# Patient Record
Sex: Female | Born: 1965 | Race: Black or African American | Hispanic: No | Marital: Single | Smoking: Never smoker
Health system: Southern US, Community
[De-identification: ages and names within clinical notes are randomized; demographics above are authoritative.]

---

## 1998-02-26 ENCOUNTER — Emergency Department (HOSPITAL_COMMUNITY): Admission: EM | Admit: 1998-02-26 | Discharge: 1998-02-27 | Payer: Self-pay | Admitting: Emergency Medicine

## 1998-07-16 ENCOUNTER — Emergency Department (HOSPITAL_COMMUNITY): Admission: EM | Admit: 1998-07-16 | Discharge: 1998-07-16 | Payer: Self-pay | Admitting: Emergency Medicine

## 1998-09-11 ENCOUNTER — Emergency Department (HOSPITAL_COMMUNITY): Admission: EM | Admit: 1998-09-11 | Discharge: 1998-09-11 | Payer: Self-pay | Admitting: Emergency Medicine

## 1999-05-21 ENCOUNTER — Emergency Department (HOSPITAL_COMMUNITY): Admission: EM | Admit: 1999-05-21 | Discharge: 1999-05-21 | Payer: Self-pay | Admitting: Emergency Medicine

## 1999-10-10 ENCOUNTER — Emergency Department (HOSPITAL_COMMUNITY): Admission: EM | Admit: 1999-10-10 | Discharge: 1999-10-10 | Payer: Self-pay | Admitting: *Deleted

## 1999-11-27 ENCOUNTER — Emergency Department (HOSPITAL_COMMUNITY): Admission: EM | Admit: 1999-11-27 | Discharge: 1999-11-27 | Payer: Self-pay | Admitting: Emergency Medicine

## 1999-11-28 ENCOUNTER — Emergency Department (HOSPITAL_COMMUNITY): Admission: EM | Admit: 1999-11-28 | Discharge: 1999-11-29 | Payer: Self-pay | Admitting: Emergency Medicine

## 2000-07-21 ENCOUNTER — Emergency Department (HOSPITAL_COMMUNITY): Admission: EM | Admit: 2000-07-21 | Discharge: 2000-07-21 | Payer: Self-pay

## 2000-08-02 ENCOUNTER — Emergency Department (HOSPITAL_COMMUNITY): Admission: EM | Admit: 2000-08-02 | Discharge: 2000-08-03 | Payer: Self-pay | Admitting: Emergency Medicine

## 2000-09-04 ENCOUNTER — Emergency Department (HOSPITAL_COMMUNITY): Admission: EM | Admit: 2000-09-04 | Discharge: 2000-09-04 | Payer: Self-pay | Admitting: *Deleted

## 2000-09-13 ENCOUNTER — Emergency Department (HOSPITAL_COMMUNITY): Admission: EM | Admit: 2000-09-13 | Discharge: 2000-09-13 | Payer: Self-pay | Admitting: *Deleted

## 2000-10-01 ENCOUNTER — Emergency Department (HOSPITAL_COMMUNITY): Admission: EM | Admit: 2000-10-01 | Discharge: 2000-10-01 | Payer: Self-pay | Admitting: Emergency Medicine

## 2001-02-19 ENCOUNTER — Emergency Department (HOSPITAL_COMMUNITY): Admission: EM | Admit: 2001-02-19 | Discharge: 2001-02-19 | Payer: Self-pay

## 2001-04-13 ENCOUNTER — Encounter: Payer: Self-pay | Admitting: Family Medicine

## 2001-04-13 ENCOUNTER — Encounter: Admission: RE | Admit: 2001-04-13 | Discharge: 2001-04-13 | Payer: Self-pay | Admitting: Family Medicine

## 2001-06-02 ENCOUNTER — Emergency Department (HOSPITAL_COMMUNITY): Admission: EM | Admit: 2001-06-02 | Discharge: 2001-06-03 | Payer: Self-pay | Admitting: Emergency Medicine

## 2001-06-12 ENCOUNTER — Emergency Department (HOSPITAL_COMMUNITY): Admission: EM | Admit: 2001-06-12 | Discharge: 2001-06-12 | Payer: Self-pay | Admitting: Emergency Medicine

## 2001-07-19 ENCOUNTER — Inpatient Hospital Stay (HOSPITAL_COMMUNITY): Admission: AD | Admit: 2001-07-19 | Discharge: 2001-07-19 | Payer: Self-pay | Admitting: *Deleted

## 2002-03-15 ENCOUNTER — Encounter: Payer: Self-pay | Admitting: Emergency Medicine

## 2002-03-15 ENCOUNTER — Emergency Department (HOSPITAL_COMMUNITY): Admission: EM | Admit: 2002-03-15 | Discharge: 2002-03-15 | Payer: Self-pay | Admitting: Emergency Medicine

## 2002-05-12 ENCOUNTER — Emergency Department (HOSPITAL_COMMUNITY): Admission: EM | Admit: 2002-05-12 | Discharge: 2002-05-12 | Payer: Self-pay | Admitting: Emergency Medicine

## 2002-05-25 ENCOUNTER — Emergency Department (HOSPITAL_COMMUNITY): Admission: EM | Admit: 2002-05-25 | Discharge: 2002-05-25 | Payer: Self-pay | Admitting: *Deleted

## 2002-11-16 ENCOUNTER — Emergency Department (HOSPITAL_COMMUNITY): Admission: EM | Admit: 2002-11-16 | Discharge: 2002-11-16 | Payer: Self-pay | Admitting: Emergency Medicine

## 2003-01-22 ENCOUNTER — Emergency Department (HOSPITAL_COMMUNITY): Admission: EM | Admit: 2003-01-22 | Discharge: 2003-01-22 | Payer: Self-pay | Admitting: Emergency Medicine

## 2003-01-23 ENCOUNTER — Emergency Department (HOSPITAL_COMMUNITY): Admission: EM | Admit: 2003-01-23 | Discharge: 2003-01-23 | Payer: Self-pay | Admitting: Emergency Medicine

## 2003-01-23 ENCOUNTER — Encounter: Payer: Self-pay | Admitting: Emergency Medicine

## 2003-02-27 ENCOUNTER — Emergency Department (HOSPITAL_COMMUNITY): Admission: EM | Admit: 2003-02-27 | Discharge: 2003-02-28 | Payer: Self-pay | Admitting: Emergency Medicine

## 2003-03-27 ENCOUNTER — Inpatient Hospital Stay (HOSPITAL_COMMUNITY): Admission: AD | Admit: 2003-03-27 | Discharge: 2003-03-27 | Payer: Self-pay | Admitting: Obstetrics & Gynecology

## 2003-03-29 ENCOUNTER — Encounter: Admission: RE | Admit: 2003-03-29 | Discharge: 2003-03-29 | Payer: Self-pay | Admitting: Obstetrics

## 2003-03-29 ENCOUNTER — Encounter: Payer: Self-pay | Admitting: Obstetrics

## 2003-04-13 ENCOUNTER — Encounter: Payer: Self-pay | Admitting: Emergency Medicine

## 2003-04-13 ENCOUNTER — Emergency Department (HOSPITAL_COMMUNITY): Admission: EM | Admit: 2003-04-13 | Discharge: 2003-04-13 | Payer: Self-pay | Admitting: Emergency Medicine

## 2003-07-26 ENCOUNTER — Emergency Department (HOSPITAL_COMMUNITY): Admission: EM | Admit: 2003-07-26 | Discharge: 2003-07-27 | Payer: Self-pay | Admitting: *Deleted

## 2003-08-01 ENCOUNTER — Emergency Department (HOSPITAL_COMMUNITY): Admission: EM | Admit: 2003-08-01 | Discharge: 2003-08-01 | Payer: Self-pay | Admitting: Emergency Medicine

## 2004-09-19 ENCOUNTER — Emergency Department (HOSPITAL_COMMUNITY): Admission: EM | Admit: 2004-09-19 | Discharge: 2004-09-19 | Payer: Self-pay | Admitting: Family Medicine

## 2005-01-09 ENCOUNTER — Emergency Department (HOSPITAL_COMMUNITY): Admission: EM | Admit: 2005-01-09 | Discharge: 2005-01-09 | Payer: Self-pay | Admitting: Emergency Medicine

## 2005-01-26 ENCOUNTER — Emergency Department (HOSPITAL_COMMUNITY): Admission: EM | Admit: 2005-01-26 | Discharge: 2005-01-26 | Payer: Self-pay | Admitting: Emergency Medicine

## 2005-03-26 ENCOUNTER — Inpatient Hospital Stay (HOSPITAL_COMMUNITY): Admission: AD | Admit: 2005-03-26 | Discharge: 2005-03-26 | Payer: Self-pay | Admitting: Obstetrics

## 2005-04-09 ENCOUNTER — Ambulatory Visit: Payer: Self-pay | Admitting: Obstetrics and Gynecology

## 2005-04-15 ENCOUNTER — Ambulatory Visit (HOSPITAL_COMMUNITY): Admission: RE | Admit: 2005-04-15 | Discharge: 2005-04-15 | Payer: Self-pay | Admitting: *Deleted

## 2005-05-07 ENCOUNTER — Encounter (INDEPENDENT_AMBULATORY_CARE_PROVIDER_SITE_OTHER): Payer: Self-pay | Admitting: *Deleted

## 2005-05-07 ENCOUNTER — Other Ambulatory Visit: Admission: RE | Admit: 2005-05-07 | Discharge: 2005-05-07 | Payer: Self-pay | Admitting: Obstetrics and Gynecology

## 2005-05-07 ENCOUNTER — Ambulatory Visit: Payer: Self-pay | Admitting: Obstetrics and Gynecology

## 2005-07-07 ENCOUNTER — Ambulatory Visit: Payer: Self-pay | Admitting: Obstetrics and Gynecology

## 2005-08-11 ENCOUNTER — Ambulatory Visit: Payer: Self-pay | Admitting: Obstetrics and Gynecology

## 2005-08-11 ENCOUNTER — Ambulatory Visit (HOSPITAL_COMMUNITY): Admission: RE | Admit: 2005-08-11 | Discharge: 2005-08-11 | Payer: Self-pay | Admitting: Obstetrics and Gynecology

## 2005-11-17 ENCOUNTER — Inpatient Hospital Stay (HOSPITAL_COMMUNITY): Admission: AD | Admit: 2005-11-17 | Discharge: 2005-11-17 | Payer: Self-pay | Admitting: Obstetrics

## 2007-10-24 ENCOUNTER — Emergency Department (HOSPITAL_COMMUNITY): Admission: EM | Admit: 2007-10-24 | Discharge: 2007-10-24 | Payer: Self-pay | Admitting: Emergency Medicine

## 2008-03-24 ENCOUNTER — Emergency Department (HOSPITAL_BASED_OUTPATIENT_CLINIC_OR_DEPARTMENT_OTHER): Admission: EM | Admit: 2008-03-24 | Discharge: 2008-03-25 | Payer: Self-pay | Admitting: Emergency Medicine

## 2008-04-19 ENCOUNTER — Ambulatory Visit: Payer: Self-pay | Admitting: Obstetrics & Gynecology

## 2008-05-01 ENCOUNTER — Ambulatory Visit (HOSPITAL_COMMUNITY): Admission: RE | Admit: 2008-05-01 | Discharge: 2008-05-01 | Payer: Self-pay | Admitting: Obstetrics & Gynecology

## 2008-05-01 ENCOUNTER — Ambulatory Visit: Payer: Self-pay | Admitting: Obstetrics & Gynecology

## 2008-05-04 ENCOUNTER — Encounter: Admission: RE | Admit: 2008-05-04 | Discharge: 2008-05-04 | Payer: Self-pay | Admitting: Obstetrics and Gynecology

## 2008-05-11 ENCOUNTER — Ambulatory Visit: Payer: Self-pay | Admitting: Obstetrics & Gynecology

## 2008-05-11 ENCOUNTER — Encounter: Payer: Self-pay | Admitting: Obstetrics & Gynecology

## 2008-05-22 ENCOUNTER — Encounter: Admission: RE | Admit: 2008-05-22 | Discharge: 2008-05-22 | Payer: Self-pay | Admitting: Infectious Diseases

## 2008-05-22 ENCOUNTER — Encounter (INDEPENDENT_AMBULATORY_CARE_PROVIDER_SITE_OTHER): Payer: Self-pay | Admitting: Radiology

## 2008-06-27 ENCOUNTER — Emergency Department (HOSPITAL_COMMUNITY): Admission: EM | Admit: 2008-06-27 | Discharge: 2008-06-27 | Payer: Self-pay | Admitting: Emergency Medicine

## 2008-10-24 ENCOUNTER — Ambulatory Visit: Payer: Self-pay | Admitting: Obstetrics & Gynecology

## 2008-10-30 ENCOUNTER — Ambulatory Visit (HOSPITAL_COMMUNITY): Admission: RE | Admit: 2008-10-30 | Discharge: 2008-10-30 | Payer: Self-pay | Admitting: Obstetrics & Gynecology

## 2008-12-26 ENCOUNTER — Other Ambulatory Visit: Payer: Self-pay | Admitting: Obstetrics & Gynecology

## 2009-01-01 ENCOUNTER — Ambulatory Visit: Payer: Self-pay | Admitting: Obstetrics & Gynecology

## 2009-01-01 ENCOUNTER — Inpatient Hospital Stay (HOSPITAL_COMMUNITY): Admission: RE | Admit: 2009-01-01 | Discharge: 2009-01-03 | Payer: Self-pay | Admitting: Obstetrics & Gynecology

## 2009-01-01 ENCOUNTER — Encounter: Payer: Self-pay | Admitting: Obstetrics & Gynecology

## 2009-01-23 ENCOUNTER — Ambulatory Visit: Payer: Self-pay | Admitting: Obstetrics & Gynecology

## 2009-04-18 IMAGING — US US PELVIS COMPLETE MODIFY
1 series · 13 of 25 positions shown · non-contrast
Comparison: 04/15/2005

CLINICAL DATA: Pelvic pain.  Fibroids.

TRANSABDOMINAL AND TRANSVAGINAL ULTRASOUND OF PELVIS
TECHNIQUE: Both transabdominal and transvaginal ultrasound
examinations of the pelvis were performed including evaluation of
the uterus, ovaries, adnexal regions, and pelvic cul-de-sac.

[Series 1: us pelvis complete modify · 0.28mm/px · 13 of 64 slices shown]
[im 1/64]
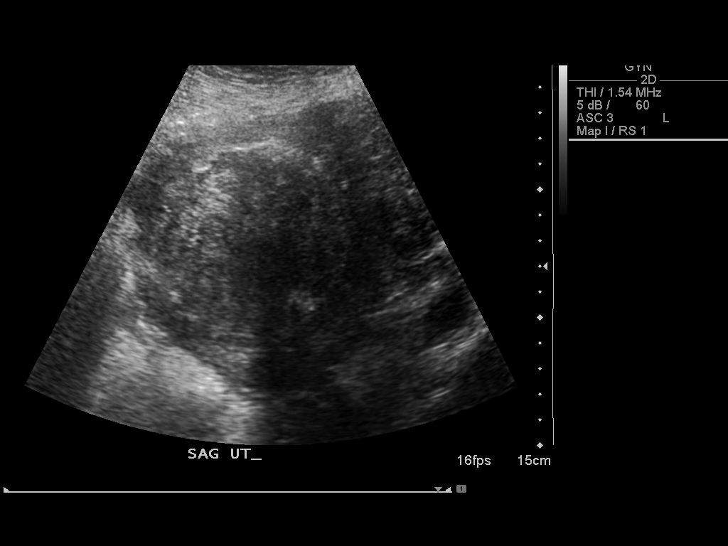
[im 6/64]
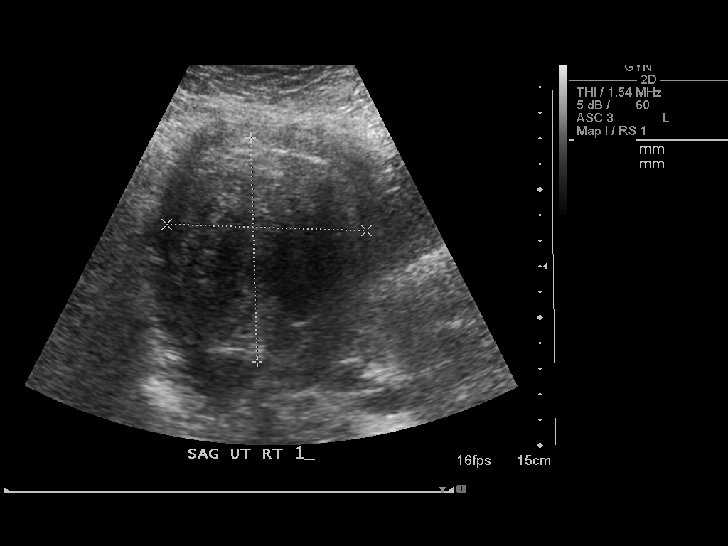
[im 11/64]
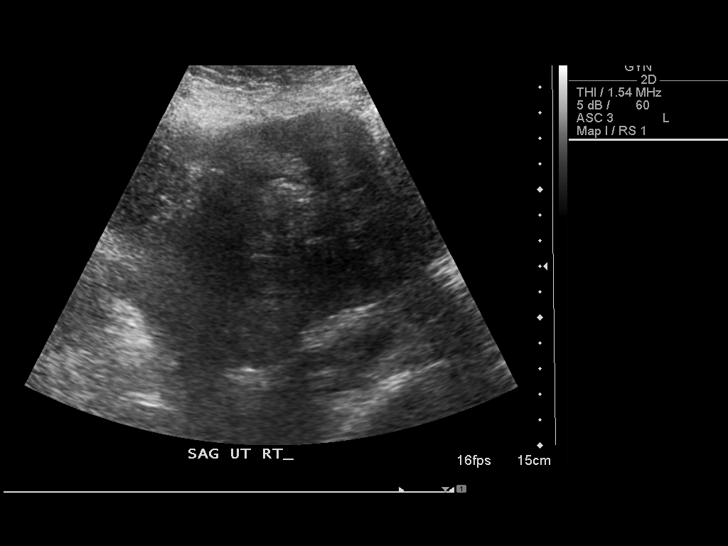
[im 16/64]
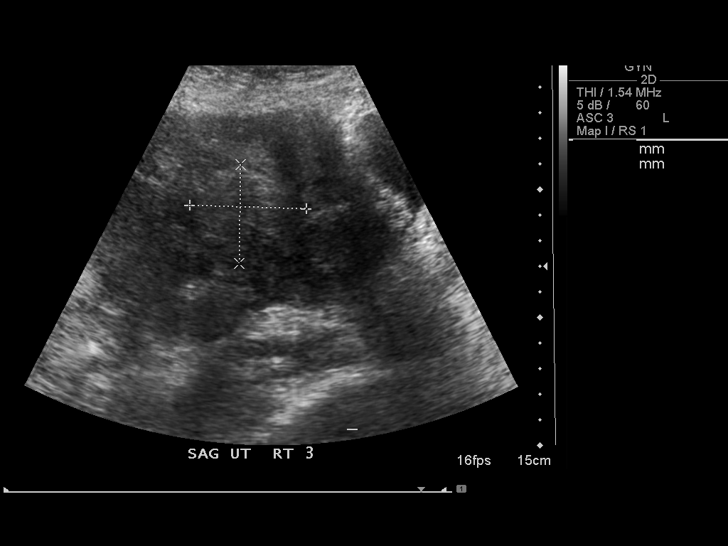
[im 22/64]
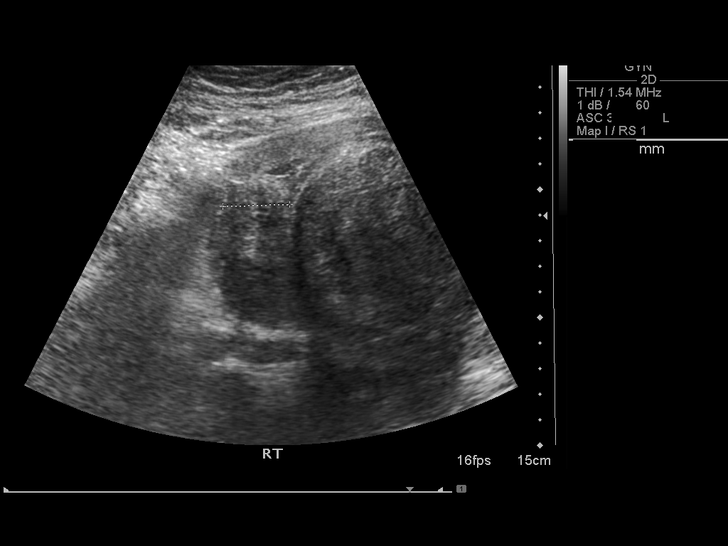
[im 27/64]
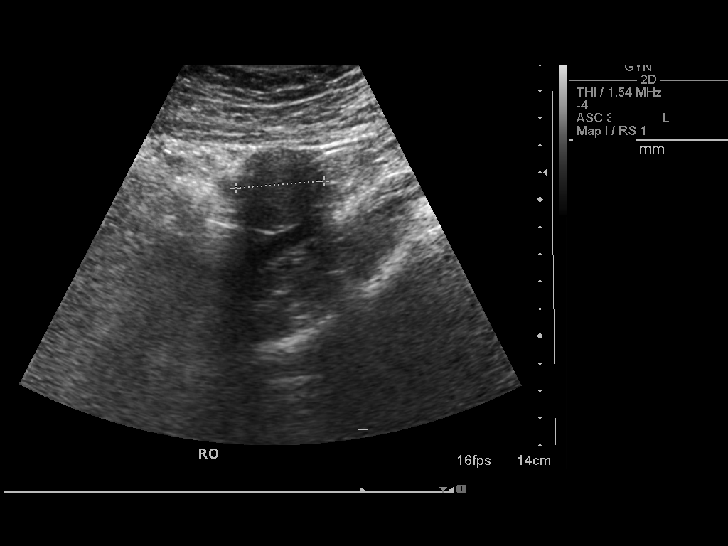
[im 32/64]
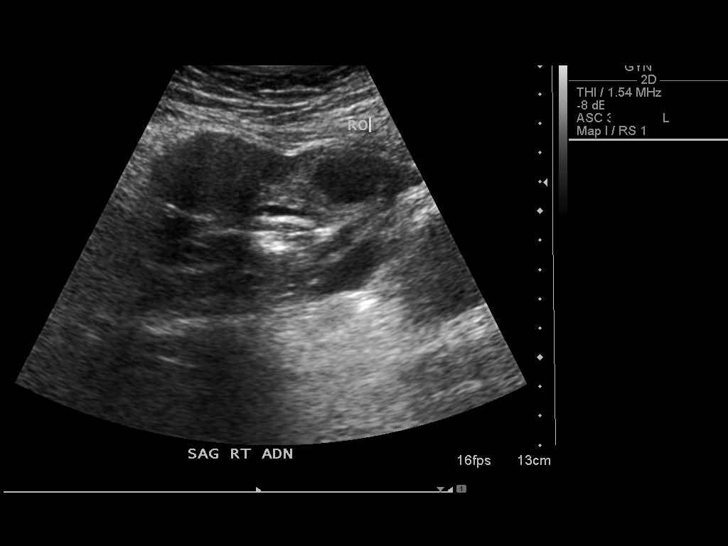
[im 37/64]
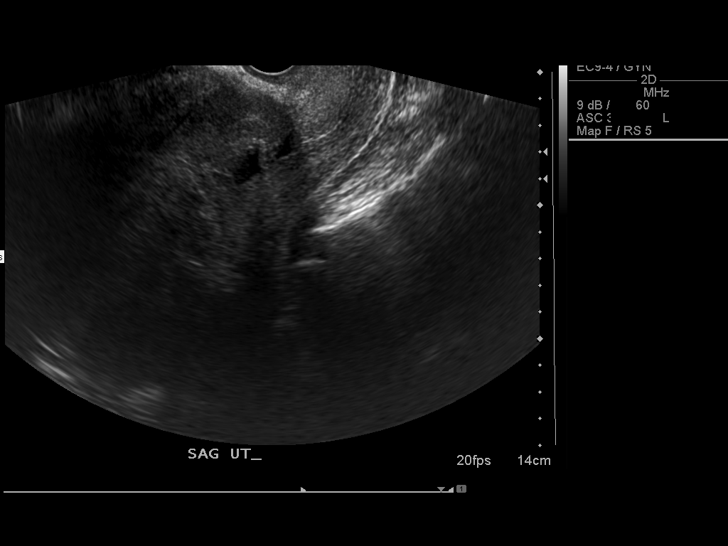
[im 43/64]
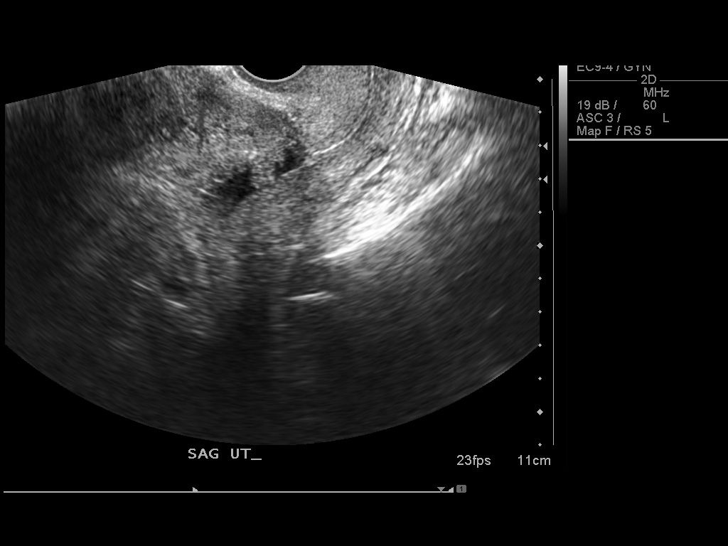
[im 48/64]
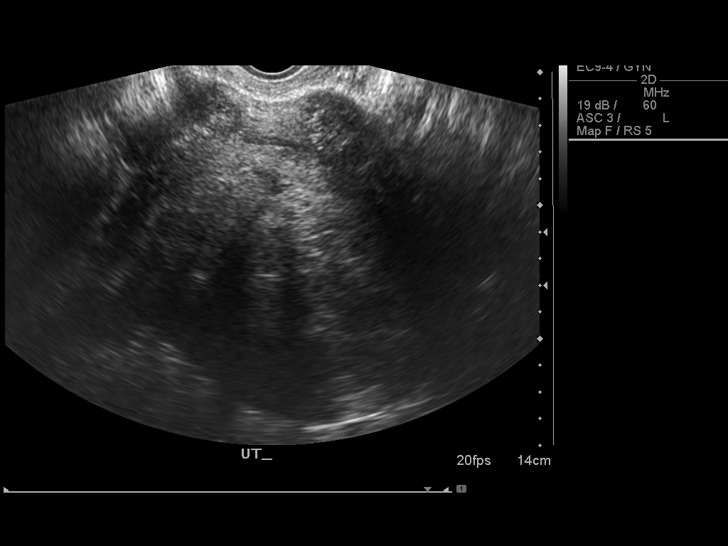
[im 53/64]
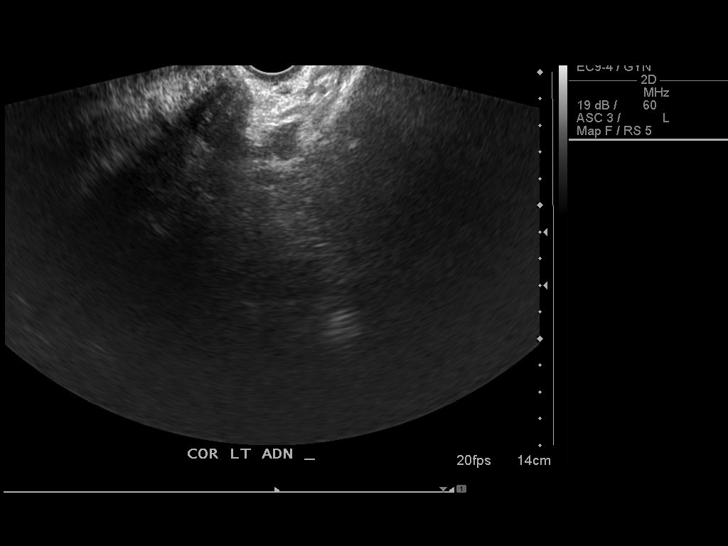
[im 58/64]
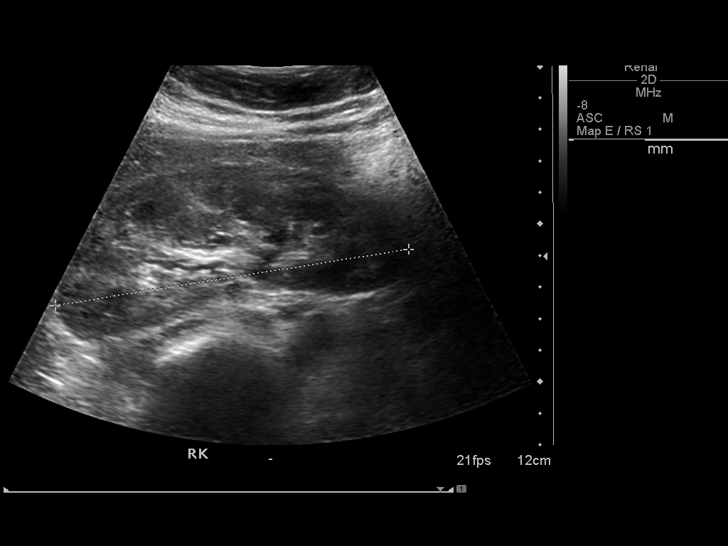
[im 64/64]
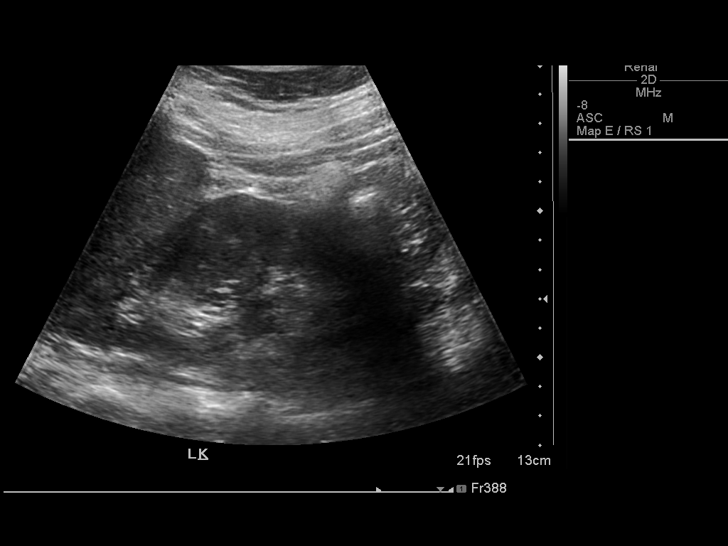

[13 of 25 positions shown; findings below may reference images not displayed]

FINDINGS: The uterus has further increase in size since prior
study, now measuring approximately 18 cm length by 11 cm AP
diameter by 12 cm transverse diameter.

Multiple uterine fibroids are seen involving the uterus diffusely.
Least seven discrete fibroids are identified.  The largest of these
is in the fundal region and has a submucosal component.  This
fibroid now measures 8.7 x 7.8 x 7.0 cm compared to 4.7 by 4.5 x
4.4 cm on prior exam.  A smaller fibroid in the central uterine
body also has a submucosal component, and this fibroid measures
cm in greatest diameter.  Other fibroids range in size from 2.0 cm
to 4.2 cm and also show increase compared to prior exam.

The endometrium is not visualized by transabdominal or transvaginal
sonography due to acoustic shadowing from these fibroids.  Both
ovaries are visualized and are normal in appearance. No other
adnexal masses are identified there is no evidence of free fluid.

Limited images of no evidence of hydronephrosis.
IMPRESSION: 1.  Increased size and number of uterine fibroids, with largest
fibroid in the fundus measuring 8.7 cm compared to 4.7 cm on prior
study.
2.  Normal ovaries.

## 2009-05-23 ENCOUNTER — Ambulatory Visit: Payer: Self-pay | Admitting: Obstetrics & Gynecology

## 2009-06-27 ENCOUNTER — Ambulatory Visit (HOSPITAL_COMMUNITY): Admission: RE | Admit: 2009-06-27 | Discharge: 2009-06-27 | Payer: Self-pay | Admitting: Obstetrics and Gynecology

## 2009-07-02 ENCOUNTER — Encounter: Admission: RE | Admit: 2009-07-02 | Discharge: 2009-07-02 | Payer: Self-pay | Admitting: Obstetrics & Gynecology

## 2009-09-07 HISTORY — PX: MYOMECTOMY: SHX85

## 2009-10-02 IMAGING — US US PELVIS COMPLETE MODIFY
2 series · 14 of 25 positions shown · non-contrast
Comparison: May 01, 2008

CLINICAL DATA: Abnormal bleeding; history of fibroids

TRANSABDOMINAL AND TRANSVAGINAL ULTRASOUND OF PELVIS
TECHNIQUE: Both transabdominal and transvaginal ultrasound
examinations of the pelvis were performed including evaluation of
the uterus, ovaries, adnexal regions, and pelvic cul-de-sac.

[Series 1: us transvaginal non-ob · 13 of 127 slices shown (1 of 2)]
[im 1/127]
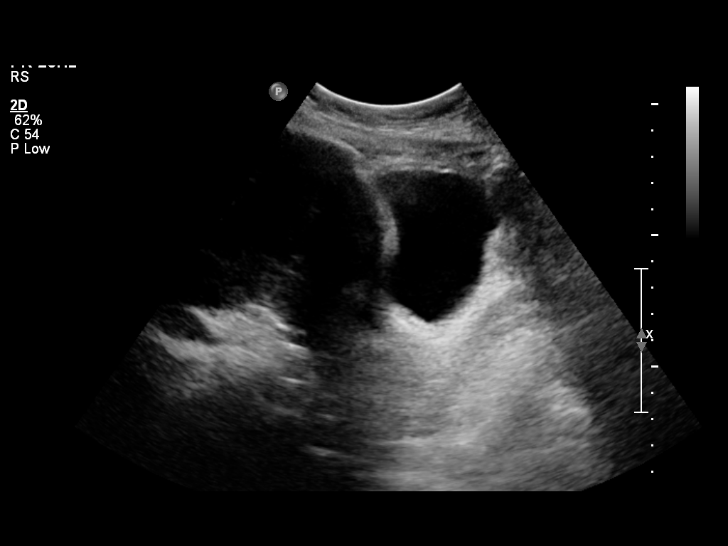
[im 11/127]
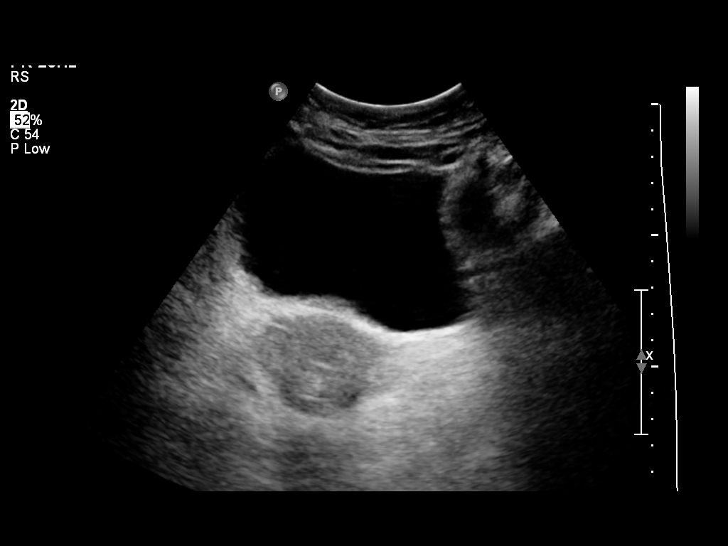
[im 22/127]
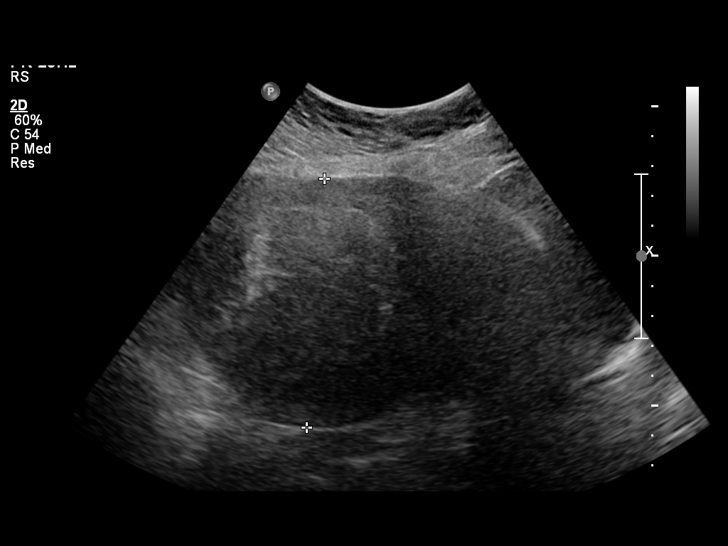
[im 33/127]
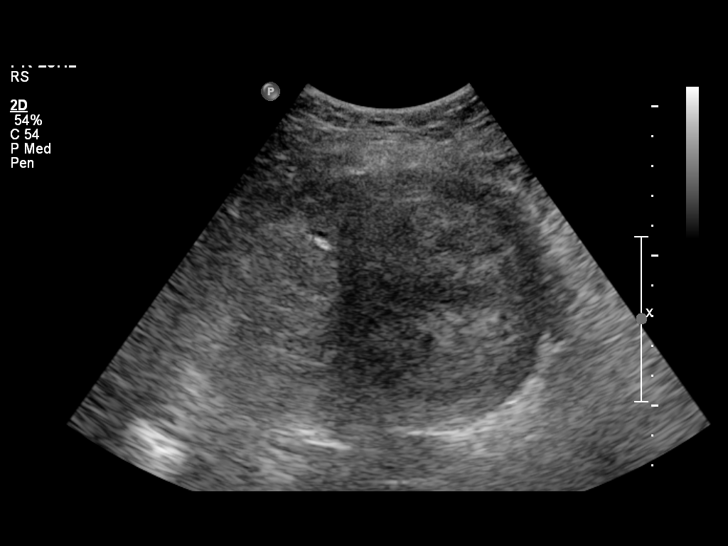
[im 44/127]
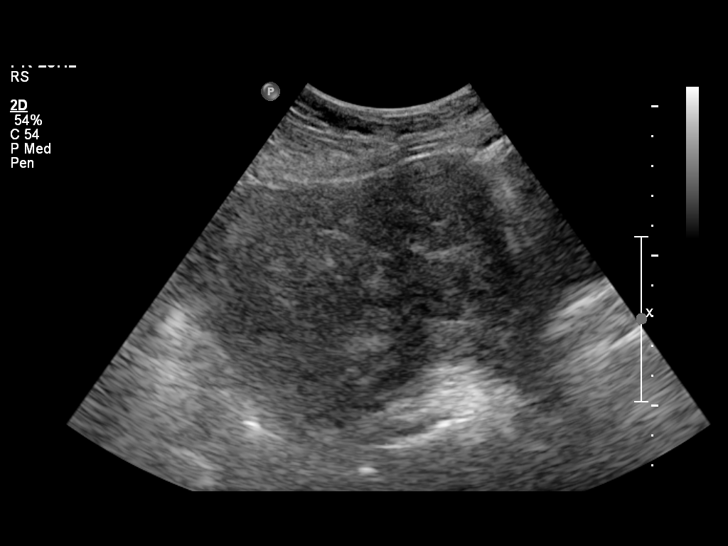
[im 50/127]
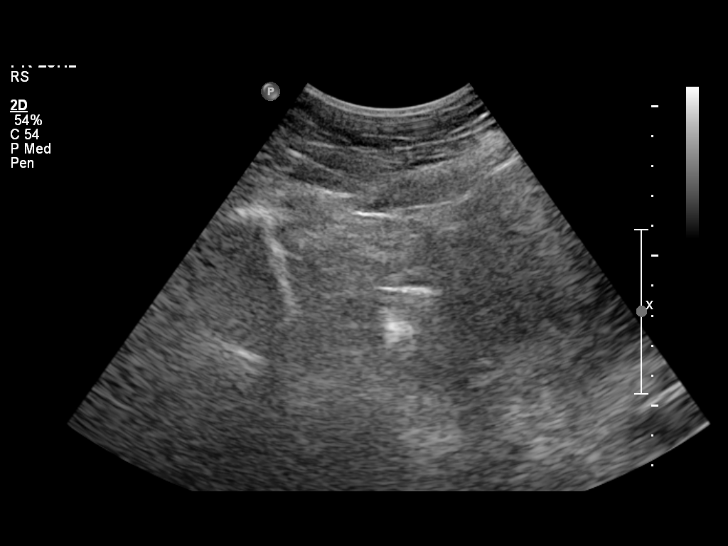
[im 61/127]
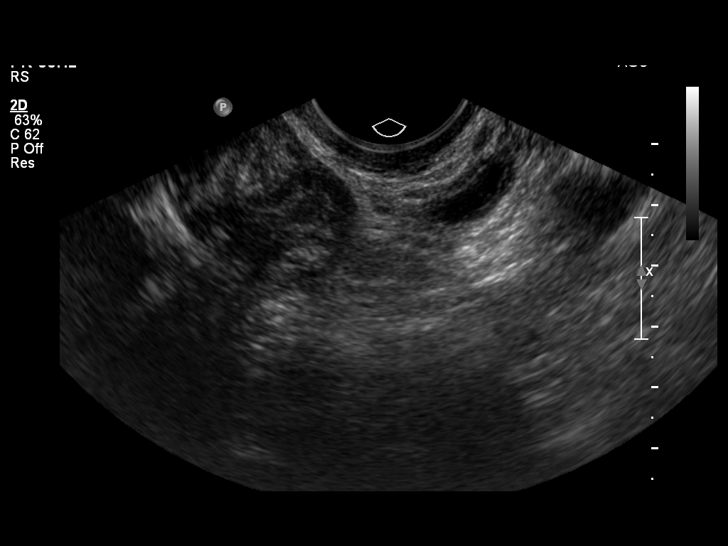
[im 72/127]
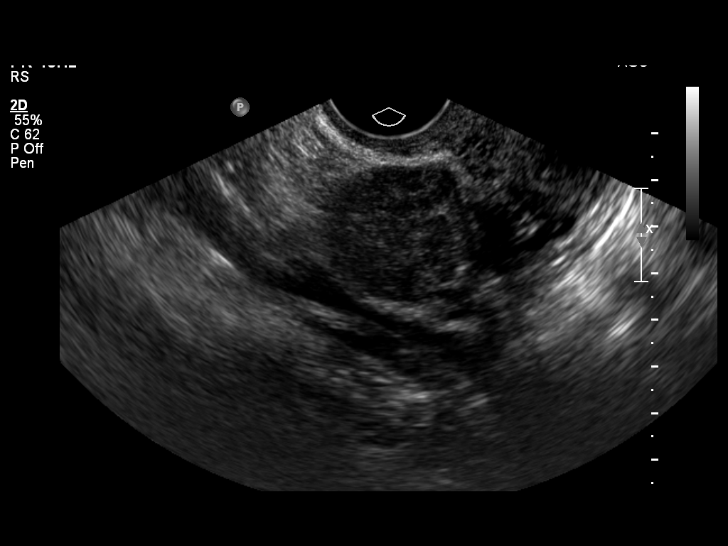
[im 83/127]
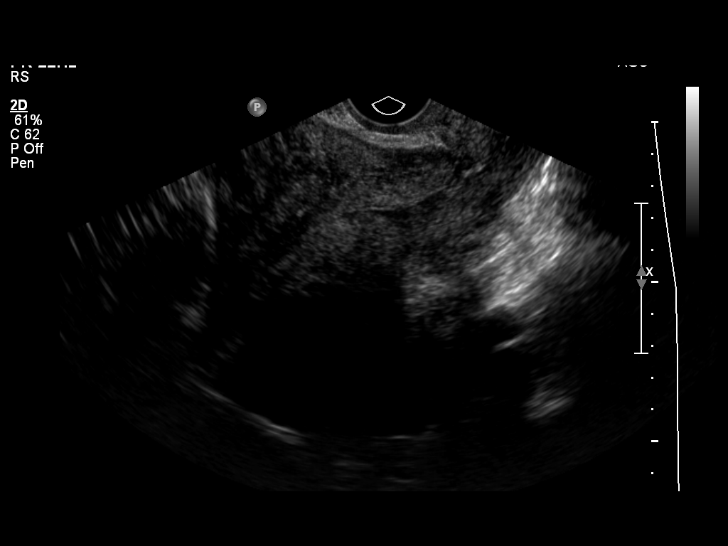
[im 88/127]
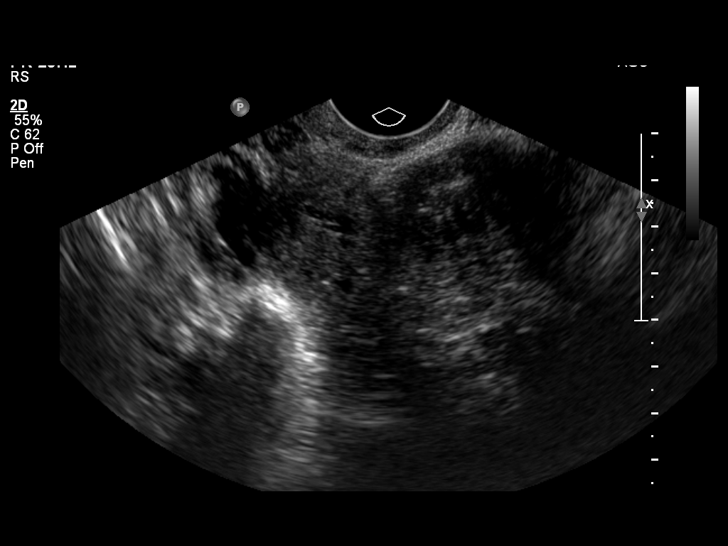
[im 99/127]
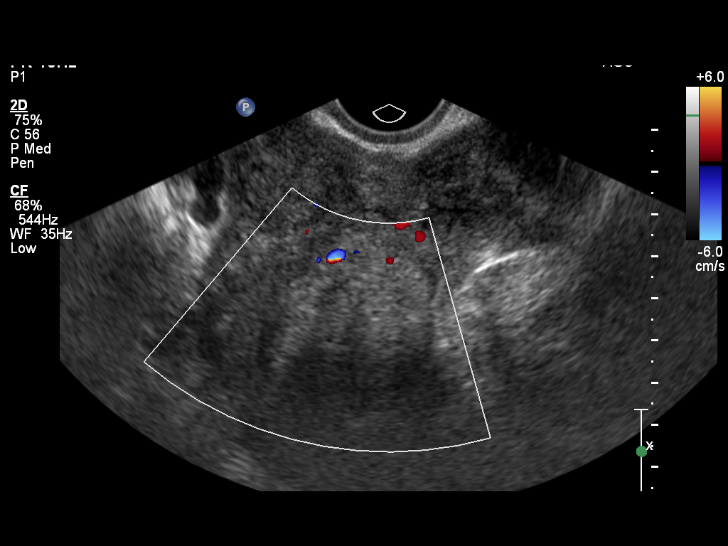
[im 110/127]
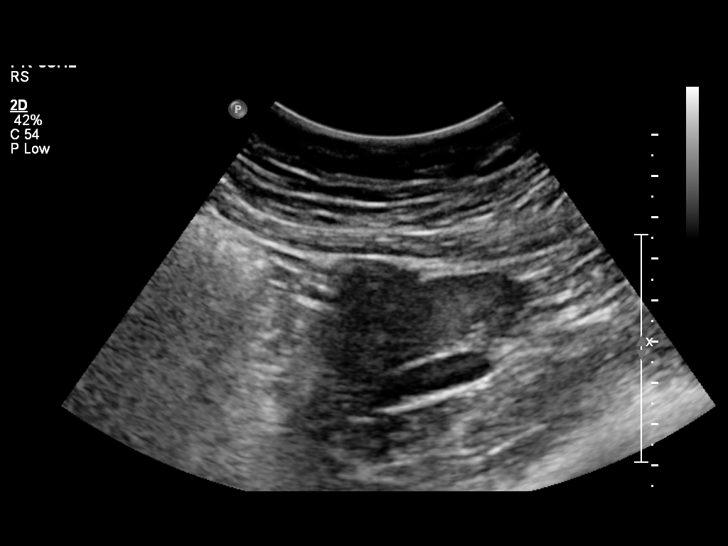
[im 121/127]
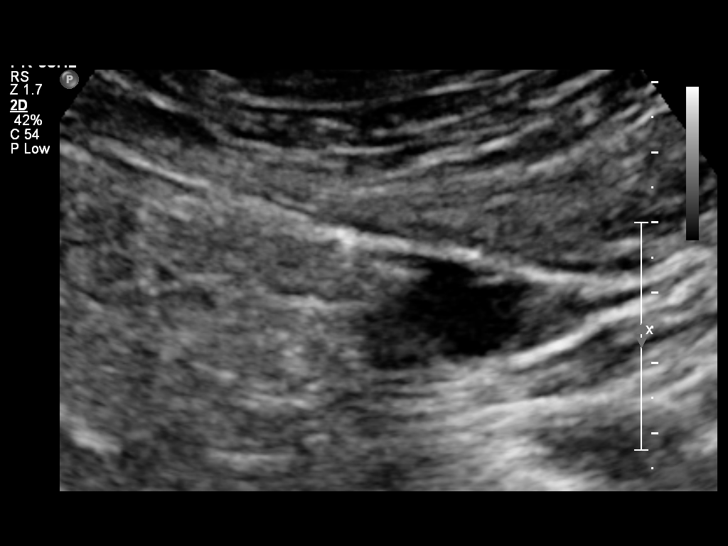

[Series 4: us transvaginal non-ob · 1 of 256 frames shown (2 of 2)]
[frame 129/256]
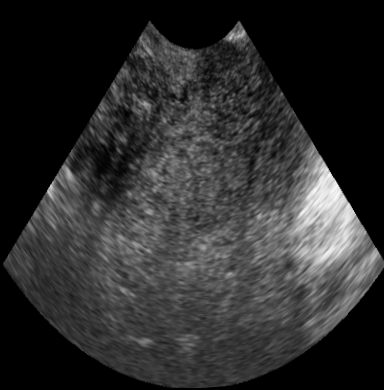

[14 of 25 positions shown; findings below may reference images not displayed]

FINDINGS: The uterus is enlarged and lobular in contour.  The
uterine dimensions are 19.3 x 8.4 x 12.0 centimeters, not
significantly changed compared the prior study.  Numerous fibroids
are present.  The largest is subserosal at the left fundus,
measuring 7.9 cm, not significantly changed.  There is also a
submucosal fibroid which measures 5 cm, unchanged.  The remainder
of the myometrial none subserosal fibroids appear unchanged, as
well.  The endometrium is obscured by the numerous fibroids.

Both ovaries have a normal size and appearance.  The right ovary
measures 5.8 x 2.9 x 4.1 cm, and the left ovary measures 3.4 x
x 2.7 cm.  There is no free pelvic fluid.
IMPRESSION: Fibroid uterus.  The size and number of the multiple fibroids
appear unchanged.

## 2010-06-16 ENCOUNTER — Ambulatory Visit: Payer: Self-pay | Admitting: Family Medicine

## 2010-06-16 LAB — CONVERTED CEMR LAB: Pap Smear: NEGATIVE

## 2010-07-08 ENCOUNTER — Ambulatory Visit (HOSPITAL_COMMUNITY): Admission: RE | Admit: 2010-07-08 | Discharge: 2010-07-08 | Payer: Self-pay | Admitting: Family Medicine

## 2010-09-10 ENCOUNTER — Ambulatory Visit: Admit: 2010-09-10 | Payer: Self-pay | Admitting: Obstetrics & Gynecology

## 2010-11-12 ENCOUNTER — Emergency Department (HOSPITAL_BASED_OUTPATIENT_CLINIC_OR_DEPARTMENT_OTHER)
Admission: EM | Admit: 2010-11-12 | Discharge: 2010-11-12 | Disposition: A | Discharge status: Home / Self Care | Payer: Self-pay | Attending: Emergency Medicine | Admitting: Emergency Medicine

## 2010-11-12 DIAGNOSIS — R05 Cough: Secondary | ICD-10-CM | POA: Insufficient documentation

## 2010-11-12 DIAGNOSIS — J40 Bronchitis, not specified as acute or chronic: Secondary | ICD-10-CM | POA: Insufficient documentation

## 2010-11-12 DIAGNOSIS — R059 Cough, unspecified: Secondary | ICD-10-CM | POA: Insufficient documentation

## 2010-12-17 LAB — CBC
HCT: 29.1 % — ABNORMAL LOW (ref 36.0–46.0)
MCHC: 33 g/dL (ref 30.0–36.0)
MCV: 81.9 fL (ref 78.0–100.0)
Platelets: 317 10*3/uL (ref 150–400)
RDW: 15.5 % (ref 11.5–15.5)
RDW: 17.5 % — ABNORMAL HIGH (ref 11.5–15.5)
WBC: 11.6 10*3/uL — ABNORMAL HIGH (ref 4.0–10.5)
WBC: 6.2 10*3/uL (ref 4.0–10.5)

## 2010-12-17 LAB — CROSSMATCH

## 2010-12-17 LAB — TYPE AND SCREEN
ABO/RH(D): O POS
Antibody Screen: NEGATIVE

## 2010-12-17 LAB — ABO/RH: ABO/RH(D): O POS

## 2011-01-20 NOTE — Discharge Summary (Signed)
[name], DENSON               ACCOUNT NO.:  [id number]   MEDICAL RECORD NO.:  [id number]          PATIENT TYPE:  INP   LOCATION:  9311                          FACILITY:  [facility]   PHYSICIAN:  Norton Blizzard, MD    DATE OF BIRTH:  October 29, 1965   DATE OF ADMISSION:  01/01/2009  DATE OF DISCHARGE:  01/03/2009                               DISCHARGE SUMMARY   ADMISSION DIAGNOSES:  1. Uterine fibroids.  2. Menorrhagia.  3. Pelvic pain.   DISCHARGE DIAGNOSES AND PROCEDURES:  Status post abdominal myomectomy.  The patient was also transfused with 2 units of packed red blood cells  in the operating room for preoperative anemia and hemoglobin of 9.3.   PERTINENT STUDIES:  Preoperative hemoglobin of 9.3, and platelet count  317.  The patient was transfused with 2 units of packed red blood cells  in the operating room.  Her postoperative hemoglobin was 9.6, and  platelet count 254.   PATHOLOGY REPORT:  Uterine fibroids, fragments of leiomyomata.  There  were 26 rubbery tan, white ovoid nodules varying in size from 0.5-8.5 cm  in greatest dimension and weighed 458 g at time of gross examination.  Of note, they weighed 536.1 g in the operating room immediately after  the myomectomy.   BRIEF HOSPITAL COURSE:  The patient is a 45 year old gravida 3, para 3  with history of 3 cesarean sections who had a symptomatic fibroid  uterus.  The patient desired future child bearing and wanted a  myomectomy.  The patient was counseled for hysterectomy, but the patient  persisted that she wanted a myomectomy because of her plans for future  childbearing.  The risks of surgery were reviewed with the patient; also  reviewed the risk during subsequent pregnancies including having to  deliver around 36- [redacted] weeks gestation as the endometrium will be  breached significantly increasing her risk of uterine rupture, and risk  of abnormal placentation including previa, accreta or worse.  The  patient verbalized  understanding of plan and wanted to proceed with  abdominal myomectomy.  She underwent an uncomplicated abdominal  myomectomy on January 01, 2009.  Her estimated blood loss was 800 mL.  Given that she was anemic preoperatively, the decision was made to  transfuse with 2 units of blood in the operating room, which resulted in  the postoperative hemoglobin of 9.6 compared to her preoperative  hemoglobin of 9.3.  After surgery, the patient had an uncomplicated post  surgical course.  She had no signs or symptoms of anemia and was started  on ferrous sulfate therapy.  By postoperative day #2, her pain was  controlled on oral pain medications.  She was ambulating and voiding  without difficulty, tolerating regular diet, and passing flatus.  The  patient was deemed stable for discharge to home.   DISCHARGE MEDICATIONS:  1. Percocet 5/325 mg 1-2 tablets p.o. q.6 h. p.r.n. pain  2. Ibuprofen 600 mg p.o. q.6 h. p.r.n. pain.  3. Colace 100 mg p.o. b.i.d. p.r.n. constipation.   DISCHARGE INSTRUCTIONS:  The patient was given the routine postoperative  instructions  including, but not limited to pelvic rest for 6 weeks, no  lifting anything greater than 15 pounds for the next 6 weeks, no driving  while on narcotic pain medications, and to keep her incision clean and  dry.  The patient was advised to shower and not to sit in bathtubs or  pools of water.  She was also strongly advised to return to the [facility] for  evaluation of any fevers, nausea, vomiting, abnormal bleeding,  incisional issues, or any other postoperative concerns. Of note, during  surgery, the endometrium was breached and the patient was told that that  is akin to having a classical cesarean section.  If she does get  pregnant, she will need a repeat cesarean section to be scheduled at 62  or 37 weeks' gestation.  The patient does also understand that given her  myomectomy she will have increased adhesive disease, which should make  her  repeat cesarean section or any subsequent surgeries more technically  difficult, and that she has an increased risk of a placenta previa,  accreta, or other abnormal placentation which might result in having  significant bleeding, maternal and fetal morbidity/mortality and also a  possible cesarean hysterectomy.   FOLLOWUP APPOINTMENTS:  The patient is to follow up with Dr. Silas Flood in the  Gynecologic Clinic Jan 23, 2009 at 1:15 p.m.  This appointment was  communicated to the patient.      Norton Blizzard, MD  Electronically Signed     UAD/MEDQ  D:  01/03/2009  T:  01/04/2009  Job:  [id number]

## 2011-01-20 NOTE — Op Note (Signed)
NAMEENSLEY, BLAS               ACCOUNT NO.:  [id number]   MEDICAL RECORD NO.:  [id number]          PATIENT TYPE:  INP   LOCATION:  9311                          [facility]ACILITY:  [facility]   PHYSICIAN:  Norton Blizzard, MD    DATE OF BIRTH:  1966-01-11   DATE OF PROCEDURE:  01/01/2009  DATE OF DISCHARGE:                               OPERATIVE REPORT   PREOPERATIVE DIAGNOSES:  1. Uterine fibroids.  2. Menorrhagia.  3. Pelvic pain.   POSTOPERATIVE DIAGNOSES:  1. Uterine fibroids.  2. Menorrhagia.  3. Pelvic pain.   PROCEDURE:  Abdominal myomectomy.   SURGEON:  Norton Blizzard, MD   ASSISTANT:  Scheryl Darter, MD   ANESTHESIA:  General.   INTRAVENOUS FLUIDS:  2600 mL of crystalloid, 2 units of packed red blood  cells.   ESTIMATED BLOOD LOSS:  800 mL.   INDICATIONS:  The patient is a 45 year old gravida 3, para 3 with a  history of 3 cesarean sections who was followed in the Gynecologic  Clinic for history of symptomatic fibroid uterus causing menorrhagia and  chronic pelvic pain.  The patient was initially treated with Depo  Lupron; however, she had a lot of night sweats and mood swings and did  not want to continue with this method.  She was very interested in a  myomectomy and refused having a hysterectomy because she is interested  in future fertility.  She insisted that she would appreciate this  procedure done in a way that would not jeopardize her future  childbearing capability.  Prior to surgery, the risks of surgery were  reviewed including but not limited to: bleeding which might require  transfusion or hysterectomy in the event of severe bleeding; infection  which might require antibiotics; injury to surrounding internal organs  including her bladder, intestines, or ureters; risk of encountering  significant adhesive disease given her 3 prior abdominal surgeries; risk  of abnormal placentation in the form of a previa, accreta or worse in  subsequent pregnancies given  her baseline increased risk factor of  having 3 prior cesarean sections, and written informed consent was  obtained.   FINDINGS:  20-week sized fibroid uterus, normal adnexa bilaterally.  26  fibroids were removed from serosal, subserosal, myometrial, and  submucosal locations.  The endometrial cavity was entered.  The sizes of  the fibroids were between 5 mm and 7 cm.  Total weight in the OR of all  fibroids were 536.1 g.  The patient had minimal adhesive disease from  her 3 cesarean sections; most of the adhesive disease was around lower  uterine segment involving the vesicouterine peritoneum.  The biggest  fibroid was submucosal and about 7 cm in diameter.  There were 2 large  vertical incisions made in the posterior fundus through which most of  the fibroids were removed, and there was a low transverse incision that  was made on the anterior surface through which multiple fibroids were  also removed.  There were about 3 smaller incisions that were made on  the top and anterior parts of the fundus to remove multiple  subserosal  and serosal fibroids.   SPECIMENS:  Multiple uterine fibroids.   DISPOSITION:  Pathology.   COMPLICATIONS:  None immediately after the case.   PROCEDURE DETAILS:  The patient received intravenous cefotetan and had  sequential compression devices applied to her lower extremities while in  the preoperative area.  She was then taken to the operating room where  general anesthesia was administered and found to be adequate.  The  patient was placed in a dorsal supine position, and prepped and draped  in a sterile manner.  A Foley catheter was inserted into the patient's  bladder and attached to constant gravity.  Attention was then turned to  the patient's abdomen where the decision was made to proceed with a  Pfannenstiel incision over her preexisting scar, as the patient was  noted to have a significant pannus.  This incision was made with the  scalpel, and  then carried through to the underlying layer of fascia  using electrocautery.  The fascia was incised in the midline, and this  incision was extended bilaterally using electrocautery.  Kochers were  applied to the superior aspect of the fascial incision, and the  underlying rectus muscles were dissected off bluntly and sharply.  Of  note, there were a lot of perforating vessels from the rectus muscles  bed and there was some bleeding encountered which was controlled using  electrocautery.  The rectus muscles were separated in the midline  bluntly and the peritoneum was also entered bluntly.  This peritoneal  incision was extended superiorly and inferiorly with care given to avoid  bowel and bladder injury.  Attention was then turned to the fibroid  uterus which was successfully exteriorized and multiple fibroids were  immediately noted.  There were 3 large incisions that were done, 1  anteriorly and 2 posteriorly.  The 2 posterior incisions were vertical  and the 1 large anterior incision was transverse and multiple fibroids  were removed from these incision.  Prior to making the incisions, a  solution containing 20 units of vasopressin diluted in 100 mL of normal  saline was injected on the serosal layer over the planned hysterotomies.  There was definitely a breach into the endometrial cavity during the  removal of the largest fibroid.  Once the fibroids were removed from an  incision, that incision was closed in several layers.  The endometrial  layer was closed with 3-0 Vicryl in running interlocking stitch, the  myometrial layer was closed with 0 Vicryl in a running interlocking  stitch and finally the serosal layer was closed with a 2-0 Vicryl  baseball stitch.  This was done on all incisions depending on the depth  of the incision with the appropriate suture.  There were smaller  subserosal and serosal fibroids that were removed from smaller incisions  and these were made hemostatic  as a result of an electrocautery or with  serosal stitches using 2-0 Vicryl in a baseball stitch configuration.  A  total of 26 fibroids were removed, size range from 5 mm to 7 cm,  and  they were found in all locations of the uterus (serosal, subserosal,  myometrial, and submucosal).  Of note, the patient's preoperative  hemoglobin was noted to be 9.3 and given that she lost significant  amount of blood, 2 units of packed red blood cells were transfused in  the operating room.  At the end of the case, there was overall good  hemostasis.  A sheet of Intercede was  placed over the large incisions on  the posterior and anterior uterine surfaces as an adhesion barrier, and  the uterus was internalized.  The peritoneal layer was reapproximated  using 2-0 Vicryl in a running stitch.  The fascial layer was  reapproximated  using 0 PDS in a running fashion.  The subcutaneous layer was copiously  irrigated and reapproximated with a 3-0 Vicryl subcuticular stitch.  The  patient tolerated the procedure and blood transfusion well.  Sponge,  instrument, and needle counts were correct x2.  She was taken to the  recovery room extubated, awake, and in stable condition.      Norton Blizzard, MD  Electronically Signed     UAD/MEDQ  D:  01/01/2009  T:  01/02/2009  Job:  [id number]

## 2011-01-20 NOTE — Group Therapy Note (Signed)
[name], [name] NO.:  [id number]   MEDICAL RECORD NO.:  [id number]          PATIENT TYPE:  WOC   LOCATION:  [facility]                   FACILITY:  [facility]   PHYSICIAN:  Elsie Lincoln, MD      DATE OF BIRTH:  [birth year]   DATE OF SERVICE:                                  CLINIC NOTE   HISTORY OF PRESENT ILLNESS:  The patient returns for routine followup  for issues with fibroids.  The patient has a past history of uterine  fibroids and a transabdominal/transvaginal ultrasound performed on  May 01, 2008.  This showed increase in size and number of uterine  fibroids with the largest in the fundus measuring 8.7 cm, compared to  4.7 cm on prior ultrasound scan.  Since her last visit here at the  [facility], she has also had mammogram that showed possible  left breast mass.  Since then, she has had the followup mammogram and is  now scheduled for breast biopsy with radiology.  The patient comes in  today with no complaints.  She is currently not on her period and has a  past history of heaviness of bleeding with her periods.  She has a  recent hemoglobin that was 7.9.  She is currently taking oral iron  therapy and is off her period, therefore we will not repeat this test  today.   OBJECTIVE:  VITAL SIGNS:  Afebrile.  Vital signs stable.  HEART:  Regular.  There are no murmurs.  ABDOMEN:  Positive bowel sounds, soft.  Uterus is enlarged and mildly  tender to palpation, consistent with an 18-20 week size uterus.  EXTREMITIES:  Distal pulses 2+.  GENITOURINARY:  Normal external female genitalia.  Vaginal mucosa is  pink and moist.  There is physiologic discharge noted.  There is no  adnexal tenderness.  Cervix appears normal with the os closed.  Uterus  on bimanual exam is enlarged but is mobile and mildly tender to  palpation.   RESULTS:  Mammogram, as already described.  Abdominal ultrasound, as  already described.  Endometrial biopsy, performed  at last visit is  negative for hyperplasia or signs of malignancy.   ASSESSMENT AND PLAN:  The patient is a 45 year old female who is G3, P3-  0-1-3 who presents for followup of uterine fibroids.  She is status post  C-section x3.  1. We will give a Depo-Lupron today.  Side effects had been discussed      with the patient.  This has been tended to shrink the size of her      uterine fibroids for possible surgery in the future.  We will have      her come back in 3 months for followup to assess the need for      repeat Depo-Lupron injection.  2. Pap smear was done today.  The patient has a history of normal Paps      in the past.  3. We will consider surgery versus repeat Depo injection at next      visit.  The patient will follow up in 2-1/2 to 3 months.  4. The patient has followup breast biopsy scheduled per radiology for      a possible left breast mass.  5. The patient will continue on iron twice daily to replete her      hemoglobin.     ______________________________  Myrtie Soman, MD    ______________________________  Elsie Lincoln, MD    TE/MEDQ  D:  05/11/2008  T:  05/12/2008  Job:  [id number]

## 2011-01-20 NOTE — Group Therapy Note (Signed)
[name], CADIENTE NO.:  [id number]   MEDICAL RECORD NO.:  [id number]          PATIENT TYPE:  WOC   LOCATION:  [facility]                   FACILITY:  [facility]   PHYSICIAN:  Elsie Lincoln, MD      DATE OF BIRTH:  06-29-1966   DATE OF SERVICE:  04/16/2008                                  CLINIC NOTE   There was a written H&P by Dr. Lawerance Cruel.  This is the surgical  attending note.   Patient is a 45 year old, almost 45 year old female with a large fibroid  uterus.  Again, details of her H&P are written in the chart.  The  patient needed a hysterectomy for her fibroids.  She was originally  scheduled in 2006 and did not show up for surgery.  She is now wanting  another chance at having a hysterectomy.  She has had three C-sections,  and her uterus is approximately 18-20 weeks size.  We are booking out  for 22 months, and the patient would benefit from Lupron to help shrink  the fibroids, to then make the surgery technically easier and also have  decreased blood loss.  The patient filled out the paperwork for a free  Lupron injection.  She will come back to [place] when that occurs.  We will  also need to get a Pap smear when she stops bleeding.  An endometrial  biopsy was done today.  Also checking CBC and TSH.   Patient will come back as soon as the injection comes in, and several  weeks after that, we can do her Pap smear.           ______________________________  Elsie Lincoln, MD     KL/MEDQ  D:  04/19/2008  T:  04/19/2008  Job:  [id number]

## 2011-01-20 NOTE — Group Therapy Note (Signed)
[name], [name] NO.:  Compton   MEDICAL RECORD NO.:  [id number]          PATIENT TYPE:  WOC   LOCATION:  [facility]                   [facility]ACILITY:  [facility]   PHYSICIAN:  [name] Moloney, MD        DATE OF BIRTH:  1966/01/24   DATE OF SERVICE:                                  CLINIC NOTE   HISTORY:  The patient is a 45 year old gravida 3, para 3 status post  abdominal myomectomy on April 27/2010 who is here for her postoperative  visit.  The patient's surgery was remarkable for removal of 26 fibroids  with a total weight of 536 gm.  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  Since the surgery, the patient denies  having any severe pain or any other concerns.  The patient is very happy  and wants to know when she can start attempting conception.  She has no  other concerns or gynecologic concerns.   PHYSICAL EXAMINATION:  VITAL SIGNS:  Temperature 98.0, pulse 69,  respirations 16, blood pressure 113/80, weight 127.6 pounds, height 5  feet 4 inches.  GENERAL:  No apparent distress.  ABDOMEN:  Soft, nontender, nondistended.  Well-healed Pfannenstiel  incision.  No erythema or any other signs of infection.  GU:  Pelvic examination is deferred.   ASSESSMENT/PLAN:  The patient is a 45 year old gravida 3 para 3 status  post abdominal myomectomy on January 01, 2009.  Of note, the patient also  has a history of 3 prior cesarean sections.  As for start of attempt to  conceive, the patient was told that she should wait at least 6 months  until her uterus heals better to start attempting to conceive.  The  patient has been on prenatal vitamins for several months now.  She was  told to continue these.  She was again counseled regarding the risks of  any subsequent pregnancies including, but not limited to abnormal  placentation that could involve having a previa or different gradations  of an accreta.  She was also told that she will need to deliver  at 36-[redacted]  weeks gestation in her subsequent pregnancies to avoid the risk of  uterine rupture.  The patient verbalized understanding.  Her last Pap  smear was in May 11, 2008 which was normal.  Her last mammogram was  in May 01, 2008.   FOLLOW UP:  She will come back in September this year for her annual  examination during which her Pap smear will be done and a mammogram  appointment will be made.           ______________________________  [name] Moloney, MD     UD/MEDQ  D:  01/23/2009  T:  01/23/2009  Job:  [id number]

## 2011-01-20 NOTE — Group Therapy Note (Signed)
[name], [name] NO.:  [id number]   MEDICAL RECORD NO.:  [id number]          PATIENT TYPE:  WOC   LOCATION:  [facility]                   FACILITY:  [facility]   PHYSICIAN:  Johnella Moloney, MD        DATE OF BIRTH:  01-03-1966   DATE OF SERVICE:  10/24/2008                                  CLINIC NOTE   CHIEF COMPLAINT:  Symptomatic uterine fibroids.   HISTORY OF PRESENT ILLNESS:  The patient is a 45 year old gravida 3,  para 3 with a history of 3 cesarean sections who has been followed for  symptomatic fibroid uterus.  The patient was last seen by Dr. Elsie Lincoln on May 11, 2008 and at that visit they reviewed the results  of her ultrasound that was done on May 01, 2008 which showed an  increase in size and number of her uterine fibroids, with the largest in  the fundus measuring 8.7 cm.  The patient was told about her different  modes of management, and she opted for Depot Lupron.  She did receive a  Depot Lupron injection and was lost to followup after this visit.  Today  the patient reports that after the Depot Lupron she did not have any  bleeding and she did notice that her fibroid uterus did come down in  size.  However, she reports having a lot of night sweats and mood  swings, and does not want to continue with this method.  She reports  today that she is very interested in trying to get pregnant again and  she is not interested in a hysterectomy, but would rather have a  myomectomy.  She says that she is still feeling the pelvic pain and does  still have heavy menstrual periods and would appreciate having this  procedure done in a way that would not jeopardize her future  childbearing capability.  She denies any other gynecologic problems.   PAST MEDICAL HISTORY:  None.   PAST SURGICAL HISTORY:  The patient has had 3 cesarean sections.   MEDICATIONS:  Multivitamins daily and iron tablets twice a day.   ALLERGIES:  No known drug allergies.   Of note, in her prior notes the  patient does list CODEINE as an allergy, but she just has mild nausea  with the administration of codeine, which is just a side-effect and not  an allergy.   SOCIAL HISTORY:  The patient denies any habits.   FAMILY HISTORY:  Is negative for any family history of gynecologic  cancers, colon cancer, or breast cancer.   PHYSICAL EXAMINATION:  Temperature 97.3, pulse 87, blood pressure is  137/97, weight 103.7 kg or 228.6 pounds, height 5 feet 4.  GENERAL:  In no apparent distress.  HEENT:  Normocephalic, atraumatic.  LUNGS:  Clear to auscultation bilaterally.  HEART:  Regular rate and rhythm.  ABDOMEN:  Soft, nontender, fibroid uterus is palpated about 2 cm above  umbilicus corresponding to a 22-week size fibroid uterus.  EXTREMITIES:  No cyanosis, clubbing or edema.  PELVIC EXAM:  Deferred at this visit.   ASSESSMENT AND PLAN:  The  patient is a 45 year old gravida 3, para 3  with symptomatic fibroid uterus who desires a myomectomy, as she desires  future childbearing.  Discussed the risks of surgery with this patient,  especially given that she wanted an abdominal myomectomy and discussed  need for going through an infraumbilical vertical incision for her  procedure, given that she has had 3 transverse incisions for her  cesarean sections.  The risks of surgery including bleeding which might  require a transfusion, or in some cases could require hysterectomy in  the event of severe bleeding were discussed with the patient.  Also  discussed the risk of infection which might require antibiotics, injury  to surrounding internal organs including her bladder, her intestines and  her ureters, also due to her history of 3 prior abdominal surgeries.  The patient was told that after her myomectomy if she does get pregnant,  she does run a risk of having abnormal placentation which could involve  either having a previa or an accreta, also as a result of having  3 prior  cesarean sections.  All questions regarding the surgery were answered  and she was given a Immunologist which details what to  expect before, during and after a myomectomy.  Of note, given that her  fibroid uterus on examination seems bigger than it was on her last  check, the patient will get a pelvic ultrasound to compare the size of  her uterus now to what it was in September 2009, and to also help with  the preoperative planning.           ______________________________  Johnella Moloney, MD     UD/MEDQ  D:  10/24/2008  T:  10/24/2008  Job:  [id number]

## 2011-01-23 NOTE — Group Therapy Note (Signed)
NAME:  [name], HOSSAIN NO.:  [id number]   MEDICAL RECORD NO.:  [id number]          PATIENT TYPE:  WOC   LOCATION:  [facility]                   FACILITY:  [facility]   PHYSICIAN:  Argentina Donovan, MD        DATE OF BIRTH:  03/15/1966   DATE OF SERVICE:  07/07/2005                                    CLINIC NOTE   The patient is a 45 year old gravida 3, para 3-0-0-3 by three cesarean  sections with a failed endometrial ablation, weighing 224 pounds, 5 feet 4  inches tall, intractable menometrorrhagia __________ having clots.  The plan  is to attempt vaginal hysterectomy, if impossible, to go abdominally.  The  patient had her last baby 11 years ago.  We are going to try and get her  record; a Dr. Wiliam Ke had done the surgery.  [facility] and we will review his note to see how much scarring actually was  present at that time.  The uterus is somewhat enlarged, though difficult to  evaluate pelvically because of the size of the patient but on ultrasound the  uterus measures 13 x 5.  The patient has several submucous fibroids.  Has  nonsignificant past medical history.  Had no significant illnesses.  Only  surgery has been her cesarean section.  She has no known allergies, is on no  medication on a regular basis and is in extremely good health with exception  of her weight.  Patient is preoperative for total vaginal hysterectomy,  possible abdominal.           ______________________________  Argentina Donovan, MD     PR/MEDQ  D:  07/07/2005  T:  07/08/2005  Job:  [id number]

## 2011-01-23 NOTE — H&P (Signed)
[name], [name]              ACCOUNT NO.:  [id number]   MEDICAL RECORD NO.:  [id number]          PATIENT TYPE:  INP   LOCATION:  NA                            [facility]   PHYSICIAN:  Phil D. Okey Dupre, M.D.     DATE OF BIRTH:  04/28/1966   DATE OF ADMISSION:  08/13/2005  DATE OF DISCHARGE:                                HISTORY & PHYSICAL   CHIEF COMPLAINT:  Always bleeding.   The patient is a 45 year old black female, gravida 4, para 3-0-1-3 with a  history of previous cesarean sections, who had a failed endometrial ablation  and still having intractable menometrorrhagia and passing clots.  The  patient is being admitted for total vaginal hysterectomy, possible abdominal  hysterectomy.  We were unable to get her records of her previous surgeries  which are probably on microfilm.   PAST MEDICAL HISTORY:  Three cesarean sections.  No significant medical  illnesses.  She has no known allergies.   MEDICATIONS:  None.   FAMILY HISTORY:  Grandfather died of colon cancer in his 95s.  Otherwise,  her family has always been in good health.  No diabetes, no hypertension.   REVIEW OF SYSTEMS:  Negative with exception of present illness.   SOCIAL HISTORY:  The patient does not  smoke, drink, or take illicit drugs.   PHYSICAL EXAMINATION:  VITAL SIGNS:  The patient has a blood pressure of  120/78.  Pulse is 76, temperature 97, and respirations 16 per minute.  She  weighs 218 pounds and is 5 feet 4 inches tall.  GENERAL:  Well-developed, well-nourished black female in no acute distress.  HEENT:  Within normal limits.  NECK:  Supple with no thyroid masses.  Neck is erect.  BREASTS:  Symmetrical with no dominant masses and no nipple discharge.  LUNGS:  Clear to auscultation and percussion.  HEART:  No murmur, normal sinus rhythm.  ABDOMEN:  Soft, flat, nontender, no masses, no organomegaly.  EXTREMITIES:  Normal with no varices and no edema.  NEUROLOGIC:  Deep tendon reflexes  normal.  PELVIC EXAM:  External genitalia is normal.  BUS within normal limits.  Vagina is clean and well rugated.  The cervix is clean, nulliparous, and the  uterus about 9-[redacted] weeks gestational size, difficult to outline completely  because of the habitus of the patient, seeming to have a small lateral  fibroid to the left.  Adnexa could not be palpated.  RECTAL:  No masses.   IMPRESSION:  Intractable menometrorrhagia.   PLAN:  Total vaginal hysterectomy, possible abdominal hysterectomy.  A long  discussion with the patient has been made in regard to possible complication  including those related to anesthesia as well as hemorrhage, injury to bowel  or urinary tract and infection.           ______________________________  Javier Glazier Okey Dupre, M.D.     PDR/MEDQ  D:  08/11/2005  T:  08/11/2005  Job:  [id number]

## 2011-01-23 NOTE — Group Therapy Note (Signed)
NAME:  [name], Compton NO.:  [id number]   MEDICAL RECORD NO.:  [id number]          PATIENT TYPE:  WOC   LOCATION[facility]                   [facility]ACILITY[facility]   PHYSICIAN:  Argentina Donovan, MD        DATE OF BIRTH:  08-07-1966   DATE OF SERVICE:  04/09/2005                                    CLINIC NOTE   REASON FOR VISIT:  This patient is a 45 year old gravida 4 para 3-0-1-3,  youngest child age 57, who works for [employer] of [place] and over this past few  months has had extremely heavy periods with clots. She was seen in the [facility]  and a hemoglobin was satisfactory. We have examined the patient and done a  Pap smear.   EXAMINATION:  Her abdomen is soft, flat, nontender. No masses, no  organomegaly. The external genitalia is normal, BUS within normal limits.  The vagina is clean and well rugated. Cervix is clean and parous although  she has had three cesarean sections. The uterus is normal size, shape, and  consistency and the adnexa could not be well palpated.   The plan is to get an ultrasound. If the ultrasound is normal, we will do a  D&C and endometrial ablation at the patient's request. She preferred not to  take hormones if at all possible. She is not sexually active at this point  and I have told her if she gets that way she must use contraception if we do  the ablation, so when the results of the ultrasound come back, schedule the  patient for ablation if okay.       PR/MEDQ  D:  04/09/2005  T:  04/09/2005  Job:  [id number]

## 2011-06-05 LAB — CBC
HCT: 28.7 — ABNORMAL LOW
Hemoglobin: 8.9 — ABNORMAL LOW
MCHC: 30.9
MCV: 69.6 — ABNORMAL LOW
Platelets: 308
RBC: 4.13
RDW: 25.4 — ABNORMAL HIGH
WBC: 12.1 — ABNORMAL HIGH

## 2011-06-05 LAB — BASIC METABOLIC PANEL
BUN: 6
Chloride: 100
GFR calc non Af Amer: >60
Glucose, Bld: 107 — ABNORMAL HIGH
Potassium: 3.8
Sodium: 136

## 2011-06-05 LAB — DIFFERENTIAL
Basophils Absolute: 0
Basophils Relative: 0
Lymphocytes Relative: 12
Lymphs Abs: 1.5
Monocytes Relative: 6
Neutro Abs: 9.9 — ABNORMAL HIGH

## 2011-06-05 LAB — SYPHILIS: RPR W/REFLEX TO RPR TITER AND TREPONEMAL ANTIBODIES, TRADITIONAL SCREENING AND DIAGNOSIS ALGORITHM: RPR Ser Ql: NONREACTIVE

## 2011-06-05 LAB — POCT PREGNANCY, URINE: Preg Test, Ur: NEGATIVE

## 2011-06-05 LAB — GC/CHLAMYDIA PROBE AMP, GENITAL: Chlamydia, DNA Probe: NEGATIVE

## 2011-06-05 LAB — URINALYSIS, ROUTINE W REFLEX MICROSCOPIC: Bilirubin Urine: NEGATIVE

## 2013-01-16 ENCOUNTER — Other Ambulatory Visit: Payer: Self-pay | Admitting: Obstetrics & Gynecology

## 2013-01-16 DIAGNOSIS — Z1231 Encounter for screening mammogram for malignant neoplasm of breast: Secondary | ICD-10-CM

## 2013-01-18 ENCOUNTER — Encounter: Payer: Self-pay | Admitting: Obstetrics & Gynecology

## 2013-01-18 ENCOUNTER — Ambulatory Visit (INDEPENDENT_AMBULATORY_CARE_PROVIDER_SITE_OTHER): Payer: Self-pay | Admitting: Obstetrics & Gynecology

## 2013-01-18 VITALS — BP 139/98 | HR 87 | Temp 97.4°F | Ht 64.0 in | Wt 233.0 lb

## 2013-01-18 DIAGNOSIS — Z01419 Encounter for gynecological examination (general) (routine) without abnormal findings: Secondary | ICD-10-CM

## 2013-01-18 NOTE — Patient Instructions (Addendum)
Herpes Simplex Herpes simplex is generally classified as Type 1 or Type 2. Type 1 is generally the type that is responsible for cold sores. Type 2 is generally associated with sexually transmitted diseases. We now know that most of the thoughts on these viruses are inaccurate. We find that HSV1 is also present genitally and HSV2 can be present orally, but this will vary in different locations of the world. Herpes simplex is usually detected by doing a culture. Blood tests are also available for this virus; however, the accuracy is often not as good.  PREPARATION FOR TEST No preparation or fasting is necessary. NORMAL FINDINGS  No virus present  No HSV antigens or antibodies present Ranges for normal findings may vary among different laboratories and hospitals. You should always check with your doctor after having lab work or other tests done to discuss the meaning of your test results and whether your values are considered within normal limits. MEANING OF TEST  Your caregiver will go over the test results with you and discuss the importance and meaning of your results, as well as treatment options and the need for additional tests if necessary. OBTAINING THE TEST RESULTS  It is your responsibility to obtain your test results. Ask the lab or department performing the test when and how you will get your results. Document Released: 09/26/2004 Document Revised: 11/16/2011 Document Reviewed: 08/04/2008 [facility] Patient Information 2013 [place], [place]. Pap Test A Pap test is a procedure done in a clinic office to evaluate cells that are on the surface of the cervix. The cervix is the lower portion of the uterus and upper portion of the vagina. For some women, the cervical region has the potential to form cancer. With consistent evaluations by your caregiver, this type of cancer can be prevented.  If a Pap test is abnormal, it is most often a result of a previous exposure to human papillomavirus (HPV).  HPV is a virus that can infect the cells of the cervix and cause dysplasia. Dysplasia is where the cells no longer look normal. If a woman has been diagnosed with high-grade or severe dysplasia, they are at higher risk of developing cervical cancer. People diagnosed with low-grade dysplasia should still be seen by their caregiver because there is a small chance that low-grade dysplasia could develop into cancer.  LET YOUR CAREGIVER KNOW ABOUT:  Recent sexually transmitted infection (STI) you have had.  Any new sex partners you have had.  History of previous abnormal Pap tests results.  History of previous cervical procedures you have had (colposcopy, biopsy, loop electrosurgical excision procedure [LEEP]).  Concerns you have had regarding unusual vaginal discharge.  History of pelvic pain.  Your use of birth control. BEFORE THE PROCEDURE  Ask your caregiver when to schedule your Pap test. It is best not to be on your period if your caregiver uses a wooden spatula to collect cells or applies cells to a glass slide. Newer techniques are not so sensitive to the timing of a menstrual cycle.  Do not douche or have sexual intercourse for 24 hours before the test.   Do not use vaginal creams or tampons for 24 hours before the test.   Empty your bladder just before the test to lessen any discomfort.  PROCEDURE You will lie on an exam table with your feet in stirrups. A warm metal or plastic instrument (speculum) is placed in your vagina. This instrument allows your caregiver to see the inside of your vagina and look at  your cervix. A small, plastic brush or wooden spatula is then used to collect cervical cells. These cells are placed in a lab specimen container. The cells are looked at under a microscope. A specialist will determine if the cells are normal.  AFTER THE PROCEDURE Make sure to get your test results.If your results come back abnormal, you may need further testing.  Document  Released: 11/14/2002 Document Revised: 11/16/2011 Document Reviewed: 08/20/2011 [facility] Patient Information 2013 [place], [place].

## 2013-01-18 NOTE — Addendum Note (Signed)
Addended by: Faythe Casa on: 01/18/2013 04:11 PM   Modules accepted: Orders

## 2013-01-18 NOTE — Progress Notes (Addendum)
Patient ID: [name], female   DOB: Aug 03, 1966, 47 y.o.   MRN: [id number] Pt is a 47 y.o. female here for a routine exam.  Current complaints:painful sore on buttocks. Pt also c/o heavy menses that are controllable.  She denies pressure sx.   History reviewed. No pertinent past medical history. Past Surgical History  Procedure Laterality Date  . Myomectomy  2011  c-section x 3  No current outpatient prescriptions on file prior to visit.   No current facility-administered medications on file prior to visit.   No Known Allergies History   Social History  . Marital Status: Single    Spouse Name: N/A    Number of Children: N/A  . Years of Education: N/A   Occupational History  . Not on file.   Social History Main Topics  . Smoking status: Never Smoker   . Smokeless tobacco: Never Used  . Alcohol Use: No  . Drug Use: No  . Sexually Active: No   Other Topics Concern  . Not on file   Social History Narrative  . No narrative on file   Family History  Problem Relation Age of Onset  . Cancer Maternal Grandfather     colon       The following portions of the patient's history were reviewed and updated as appropriate: allergies, current medications, past family history, past medical history, past social history, past surgical history and problem list.  Review of Systems Pertinent items are noted in HPI.    Objective:   BP 139/98  Pulse 87  Temp(Src) 97.4 F (36.3 C) (Oral)  Ht 5\' 4"  (1.626 m)  Wt 233 lb (105.688 kg)  BMI 39.97 kg/m2  LMP 12/15/2012 Pt in NAD  General Appearance:    Alert, cooperative, no distress, appears stated age              Throat:   Lips, mucosa, and tongue normal; teeth and gums normal  Neck:   Supple, symmetrical, trachea midline, no adenopathy;    thyroid:  no enlargement/tenderness/nodules; no carotid   bruit or JVD  Back:     Symmetric, no curvature, ROM normal, no CVA tenderness  Lungs:     Clear to auscultation  bilaterally, respirations unlabored  Chest Wall:    No tenderness or deformity   Heart:    Regular rate and rhythm, S1 and S2 normal, no murmur, rub   or gallop  Breast Exam:    No tenderness, masses, or nipple abnormality  Abdomen:     Soft, non-tender, bowel sounds active all four quadrants,    no masses, no organomegaly  Genitalia:    Normal female without lesion, discharge or tenderness there is an excoriated tender lesion on her left buttock.  Uterus ~12 weeks sized. Mobile. HSV cx obtained of the lesion      Extremities:   Extremities normal, atraumatic, no cyanosis or edema  Pulses:   2+ and symmetric all extremities  Skin:   Skin color, texture, turgor normal, no rashes or lesions  Lymph nodes:   Cervical, supraclavicular, and axillary nodes normal         Assessment:    Healthy female exam.  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   Mammogram in am F/u PAP and cx F/u HSV cx F/u 01/19/2013 for labs: TSH, Lipid panel, Chem 12 and CBC pt was instructed to be fasting   Eber Jones  Ara KussmaulL. Harraway-Smith, M.D., Evern CoreFACOG

## 2013-01-19 ENCOUNTER — Ambulatory Visit (HOSPITAL_COMMUNITY)
Admission: RE | Admit: 2013-01-19 | Discharge: 2013-01-19 | Disposition: A | Discharge status: Home / Self Care | Payer: Self-pay | Source: Ambulatory Visit | Attending: Obstetrics & Gynecology | Admitting: Obstetrics & Gynecology

## 2013-01-19 DIAGNOSIS — Z1231 Encounter for screening mammogram for malignant neoplasm of breast: Secondary | ICD-10-CM

## 2013-01-20 ENCOUNTER — Other Ambulatory Visit: Payer: Self-pay

## 2013-01-23 ENCOUNTER — Other Ambulatory Visit: Payer: Self-pay

## 2013-01-23 ENCOUNTER — Telehealth: Payer: Self-pay

## 2013-01-23 ENCOUNTER — Other Ambulatory Visit: Payer: Self-pay | Admitting: Obstetrics & Gynecology

## 2013-01-23 DIAGNOSIS — A6009 Herpesviral infection of other urogenital tract: Secondary | ICD-10-CM

## 2013-01-23 LAB — HERPES SIMPLEX VIRUS CULTURE: Organism ID, Bacteria: DETECTED

## 2013-01-23 MED ORDER — VALACYCLOVIR HCL 1 G PO TABS: 1000.0000 mg | ORAL_TABLET | Freq: Two times a day (BID) | ORAL | Status: DC

## 2013-01-23 NOTE — Telephone Encounter (Signed)
Message copied by Faythe Casa on Mon Jan 23, 2013 12:00 PM ------      Message from: Willodean Rosenthal      Created: Mon Jan 23, 2013 10:49 AM       Please call pt.  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  If lesion is still present she can take Valtrex tid.  She may make return ov to discuss further is she desires.            clh-S  ------

## 2013-01-26 ENCOUNTER — Other Ambulatory Visit: Payer: Self-pay

## 2013-01-26 NOTE — Telephone Encounter (Signed)
Called patient and left a voicemail for her to call us back.

## 2013-01-27 ENCOUNTER — Telehealth: Payer: Self-pay | Admitting: *Deleted

## 2013-01-27 MED ORDER — ACYCLOVIR 400 MG PO TABS: 800.0000 mg | ORAL_TABLET | Freq: Two times a day (BID) | ORAL | Status: DC

## 2013-01-27 NOTE — Telephone Encounter (Signed)
Attempted to contact patient. No answer. Left message to call clinic back.  

## 2013-01-27 NOTE — Telephone Encounter (Signed)
Pt left message stating that the medication ordered is too expensive. She would like alternate prescription. Pt notified that alternate Rx has been sent to her pharmacy. She voiced understanding.

## 2013-02-22 ENCOUNTER — Ambulatory Visit: Payer: Self-pay | Admitting: Obstetrics & Gynecology

## 2013-11-24 ENCOUNTER — Encounter (HOSPITAL_BASED_OUTPATIENT_CLINIC_OR_DEPARTMENT_OTHER): Payer: Self-pay | Admitting: Emergency Medicine

## 2013-11-24 ENCOUNTER — Emergency Department (HOSPITAL_BASED_OUTPATIENT_CLINIC_OR_DEPARTMENT_OTHER)
Admission: EM | Admit: 2013-11-24 | Discharge: 2013-11-24 | Disposition: A | Discharge status: Home / Self Care | Payer: BC Managed Care – PPO | Attending: Emergency Medicine | Admitting: Emergency Medicine

## 2013-11-24 DIAGNOSIS — K044 Acute apical periodontitis of pulpal origin: Secondary | ICD-10-CM | POA: Insufficient documentation

## 2013-11-24 DIAGNOSIS — K047 Periapical abscess without sinus: Secondary | ICD-10-CM

## 2013-11-24 DIAGNOSIS — Z79899 Other long term (current) drug therapy: Secondary | ICD-10-CM | POA: Insufficient documentation

## 2013-11-24 DIAGNOSIS — J029 Acute pharyngitis, unspecified: Secondary | ICD-10-CM | POA: Insufficient documentation

## 2013-11-24 MED ORDER — PENICILLIN V POTASSIUM 500 MG PO TABS: 500.0000 mg | ORAL_TABLET | Freq: Four times a day (QID) | ORAL | Status: DC

## 2013-11-24 MED ORDER — HYDROCODONE-ACETAMINOPHEN 5-325 MG PO TABS: 1.0000 | ORAL_TABLET | Freq: Four times a day (QID) | ORAL | Status: DC | PRN

## 2013-11-24 NOTE — ED Notes (Signed)
Gingiva swelling x 2 days.

## 2013-11-24 NOTE — ED Provider Notes (Signed)
I saw and evaluated the patient, reviewed the resident's note and I agree with the findings and plan.   EKG Interpretation None      Poor dentition, gingival swelling, dental followup with Abx.   Charles B. Karle Starch, MD 11/24/13 1344

## 2013-11-24 NOTE — Discharge Instructions (Signed)
Abscessed Tooth  An abscessed tooth is an infection around your tooth. It may be caused by holes or damage to the tooth (cavity) or a dental disease. An abscessed tooth causes mild to very bad pain in and around the tooth. See your dentist right away if you have tooth or gum pain.  HOME CARE   Take your medicine as told. Finish it even if you start to feel better.   Do not drive after taking pain medicine.   Rinse your mouth (gargle) often with salt water ( teaspoon salt in 8 ounces of warm water).   Do not apply heat to the outside of your face.  GET HELP RIGHT AWAY IF:    You have a temperature by mouth above 102 F (38.9 C), not controlled by medicine.   You have chills and a very bad headache.   You have problems breathing or swallowing.   Your mouth will not open.   You develop puffiness (swelling) on the neck or around the eye.   Your pain is not helped by medicine.   Your pain is getting worse instead of better.  MAKE SURE YOU:    Understand these instructions.   Will watch your condition.   Will get help right away if you are not doing well or get worse.  Document Released: 02/10/2008 Document Revised: 11/16/2011 Document Reviewed: 12/02/2010  [employer] Patient Information 2014 [employer].

## 2013-11-24 NOTE — ED Provider Notes (Signed)
CSN: [id number]     Arrival date & time 11/24/13  0712 History   First MD Initiated Contact with Patient 11/24/13 0719     Chief Complaint  Patient presents with  . Oral Swelling   HPI  [name] is 48 y.o. female presented to the Ed with left sided facial/mouth swelling that started 2 days ago. Patient states that she had a dental cap in the location where food was lodged 2 days ago. The dental cap fell off years ago. She took a toothpick to attempt to get the food out from between her teeth. She states she has been picking at it for approximately 2 days with a toothpick. She states that it is painful and she pressed BC powder up into the wound. Patient does not think that she had a fever. She reports that the bleeding is controlled even when brushing her teeth. She doesn't have a bad taste in her mouth or feel that it is draining. He is not experiencing any ear pain or jaw pain or facial pain. She doesn't a little bit of a sore throat. The area of infection is hurting her a 7/10 pain and she is unable to eat. Patient has a partial denture that is removable.  History reviewed. No pertinent past medical history. Past Surgical History  Procedure Laterality Date  . Myomectomy  2011  . Cesarean section     Family History  Problem Relation Age of Onset  . Cancer Maternal Grandfather     colon   History  Substance Use Topics  . Smoking status: Never Smoker   . Smokeless tobacco: Never Used  . Alcohol Use: No   OB History   Grav Para Term Preterm Abortions TAB SAB Ect Mult Living   4 3 3  0 1 0 1 0 0 3     Review of Systems  Constitutional: Negative for fever, chills, activity change, appetite change and fatigue.  HENT: Positive for sore throat. Negative for ear pain, facial swelling, mouth sores and trouble swallowing.   Eyes: Negative for pain, discharge, redness and itching.  Musculoskeletal: Negative for neck pain and neck stiffness.  Neurological: Negative for headaches.     Allergies  Review of patient's allergies indicates no known allergies.  Home Medications   Current Outpatient Rx  Name  Route  Sig  Dispense  Refill  . acyclovir (ZOVIRAX) 400 MG tablet   Oral   Take 2 tablets (800 mg total) by mouth 2 (two) times daily.   20 tablet   0   . HYDROcodone-acetaminophen (NORCO) 5-325 MG per tablet   Oral   Take 1 tablet by mouth every 6 (six) hours as needed for moderate pain.   15 tablet   0   . ibuprofen (ADVIL,MOTRIN) 200 MG tablet   Oral   Take 200 mg by mouth every 6 (six) hours as needed for pain.         . Multiple Vitamins-Minerals (MULTIVITAMIN WITH MINERALS) tablet   Oral   Take 1 tablet by mouth daily.         . penicillin v potassium (VEETID) 500 MG tablet   Oral   Take 1 tablet (500 mg total) by mouth 4 (four) times daily.   40 tablet   0   . vitamin C (ASCORBIC ACID) 250 MG tablet   Oral   Take 250 mg by mouth daily.          BP 144/94  Pulse 97  Temp(Src) 98.6 F (37 C) (Oral)  Resp 16  Ht 5\' 4"  (1.626 m)  Wt 222 lb (100.699 kg)  BMI 38.09 kg/m2  SpO2 100%  LMP 11/17/2013 Physical Exam Gen: NAD. Pleasant female HEENT: AT. [place].  Bilateral eyes without injections or icterus. MMM. Bilateral nares without erythema. Throat without erythema or exudates. Left gingival swelling around back 3 molars. No lymphadenopathy. No facial tenderness over his zygomatic, maxillary sinuses or ear. CV: RRR, no murmurs, clicks, gallops, rubs Chest: CTAB, no wheeze or crackles Abd: Soft. NTND. BS present. No Masses palpated.  Ext: No erythema. No edema.  Skin: No rashes, purpura or petechiae.  Neuro:  Normal gait. PERLA. EOMi. Alert. Grossly intact.  Psych: Normal affect, and trust, demeanor and speech.  ED Course  Procedures (including critical care time) Labs Review Labs Reviewed - No data to display Imaging Review No results found.   EKG Interpretation None      MDM   Final diagnoses:  Dental infection    Patient presented the ED with dental infection. Patient was in no acute distress, nontoxic and afebrile, without signs of spreading infection (no lymphadenopathy). Patient is without dental insurance. She also to herself and to the ED today. Patient was given prescription for penicillin  to cover infection and Norco #15 for pain. Patient is given contact information for Dr. Alease Medina, who is covering call today and [facility]. She is encouraged to call those locations today and set up an appointment for as soon as possible.    Ma Hillock, DO 11/24/13 [phone]

## 2014-07-09 ENCOUNTER — Encounter (HOSPITAL_BASED_OUTPATIENT_CLINIC_OR_DEPARTMENT_OTHER): Payer: Self-pay | Admitting: Emergency Medicine

## 2014-07-12 ENCOUNTER — Other Ambulatory Visit (HOSPITAL_COMMUNITY): Payer: Self-pay | Admitting: Obstetrics

## 2014-08-29 ENCOUNTER — Ambulatory Visit: Payer: BC Managed Care – PPO | Admitting: Obstetrics & Gynecology

## 2014-10-05 ENCOUNTER — Ambulatory Visit: Payer: BC Managed Care – PPO | Admitting: Obstetrics & Gynecology

## 2015-01-04 ENCOUNTER — Ambulatory Visit: Payer: Self-pay | Admitting: Obstetrics & Gynecology

## 2015-05-08 ENCOUNTER — Other Ambulatory Visit: Payer: Self-pay | Admitting: Obstetrics & Gynecology

## 2015-05-08 DIAGNOSIS — Z1231 Encounter for screening mammogram for malignant neoplasm of breast: Secondary | ICD-10-CM

## 2015-05-21 ENCOUNTER — Ambulatory Visit (HOSPITAL_COMMUNITY): Payer: Self-pay

## 2015-05-22 ENCOUNTER — Other Ambulatory Visit: Payer: Self-pay | Admitting: Obstetrics & Gynecology

## 2015-05-22 DIAGNOSIS — Z1231 Encounter for screening mammogram for malignant neoplasm of breast: Secondary | ICD-10-CM

## 2015-05-30 ENCOUNTER — Ambulatory Visit (HOSPITAL_COMMUNITY): Payer: Self-pay

## 2015-06-05 ENCOUNTER — Encounter: Payer: Self-pay | Admitting: Obstetrics & Gynecology

## 2015-06-05 ENCOUNTER — Ambulatory Visit (INDEPENDENT_AMBULATORY_CARE_PROVIDER_SITE_OTHER): Payer: Self-pay | Admitting: Obstetrics & Gynecology

## 2015-06-05 VITALS — BP 144/96 | HR 76 | Temp 98.4°F | Ht 64.0 in | Wt 208.4 lb

## 2015-06-05 DIAGNOSIS — D259 Leiomyoma of uterus, unspecified: Secondary | ICD-10-CM

## 2015-06-05 DIAGNOSIS — Z113 Encounter for screening for infections with a predominantly sexual mode of transmission: Secondary | ICD-10-CM

## 2015-06-05 DIAGNOSIS — N939 Abnormal uterine and vaginal bleeding, unspecified: Secondary | ICD-10-CM | POA: Insufficient documentation

## 2015-06-05 DIAGNOSIS — Z118 Encounter for screening for other infectious and parasitic diseases: Secondary | ICD-10-CM

## 2015-06-05 DIAGNOSIS — D219 Benign neoplasm of connective and other soft tissue, unspecified: Secondary | ICD-10-CM | POA: Insufficient documentation

## 2015-06-05 MED ORDER — NORGESTIMATE-ETH ESTRADIOL 0.25-35 MG-MCG PO TABS: 1.0000 | ORAL_TABLET | Freq: Every day | ORAL | Status: AC

## 2015-06-05 MED ORDER — NORGESTIMATE-ETH ESTRADIOL 0.25-35 MG-MCG PO TABS: 1.0000 | ORAL_TABLET | Freq: Every day | ORAL | Status: DC

## 2015-06-05 NOTE — Addendum Note (Signed)
Addended by: Shelly Coss on: 06/05/2015 04:58 PM   Modules accepted: Orders

## 2015-06-05 NOTE — Progress Notes (Signed)
Patient ID: [name], female   DOB: 12-30-1965, 49 y.o.   MRN: [id number] History:  49 y.o. [id number] here today for eval of heavy bleeding abd mass.  She reports that she had a myomectomy in 2010 with Dr. Harolyn Rutherford.  She thinks that she has the same sx.  She reports heavy bleeding with clots.  She denies pain.  The following portions of the patient's history were reviewed and updated as appropriate: allergies, current medications, past family history, past medical history, past social history, past surgical history and problem list.  Review of Systems:  Pertinent items are noted in HPI.  Objective:  Physical Exam Blood pressure 144/96, pulse 76, temperature 98.4 F (36.9 C), temperature source Oral, height 5\' 4"  (1.626 m), weight 208 lb 6.4 oz (94.53 kg), last menstrual period 06/03/2015. Gen: NAD Abd: Soft, nontender and nondistended. Mass effect to umbilicus Pelvic: Normal appearing external genitalia; normal appearing vaginal mucosa and cervix.  Normal discharge.  Enlarged uteres ~18 weeks sized no other palpable masses- but, size limited by mass and body habitus.  No adnexal tenderness  Labs and Imaging No results found.  Assessment & Plan:  Sx uterine fibroids Needs pelvic sono Pt wants hyst.  Filled out financial aid paperwork F/u with Dr. Harolyn Rutherford to discuss surgery after financial aid paperwork completed  Spriintec 2 po q day x 4 days and q day  [name], M.D., [name]

## 2015-06-05 NOTE — Patient Instructions (Signed)

## 2015-06-06 ENCOUNTER — Ambulatory Visit (HOSPITAL_COMMUNITY)
Admission: RE | Admit: 2015-06-06 | Discharge: 2015-06-06 | Disposition: A | Discharge status: Home / Self Care | Payer: Self-pay | Source: Ambulatory Visit | Attending: Obstetrics & Gynecology | Admitting: Obstetrics & Gynecology

## 2015-06-06 DIAGNOSIS — Z1231 Encounter for screening mammogram for malignant neoplasm of breast: Secondary | ICD-10-CM

## 2015-06-07 LAB — GC/CHLAMYDIA PROBE AMP (~~LOC~~) NOT AT ARMC
Chlamydia: NEGATIVE
Neisseria Gonorrhea: NEGATIVE

## 2015-06-11 ENCOUNTER — Ambulatory Visit (HOSPITAL_COMMUNITY)
Admission: RE | Admit: 2015-06-11 | Discharge: 2015-06-11 | Disposition: A | Discharge status: Home / Self Care | Payer: Self-pay | Source: Ambulatory Visit | Attending: Obstetrics & Gynecology | Admitting: Obstetrics & Gynecology

## 2015-06-11 DIAGNOSIS — D259 Leiomyoma of uterus, unspecified: Secondary | ICD-10-CM

## 2015-06-11 DIAGNOSIS — D252 Subserosal leiomyoma of uterus: Secondary | ICD-10-CM | POA: Insufficient documentation

## 2015-06-11 DIAGNOSIS — N939 Abnormal uterine and vaginal bleeding, unspecified: Secondary | ICD-10-CM | POA: Insufficient documentation

## 2015-06-26 ENCOUNTER — Ambulatory Visit: Payer: Self-pay | Admitting: Obstetrics & Gynecology

## 2015-07-19 ENCOUNTER — Ambulatory Visit (INDEPENDENT_AMBULATORY_CARE_PROVIDER_SITE_OTHER): Payer: Self-pay | Admitting: Obstetrics & Gynecology

## 2015-07-19 ENCOUNTER — Encounter: Payer: Self-pay | Admitting: Obstetrics & Gynecology

## 2015-07-19 VITALS — BP 146/100 | HR 91 | Temp 98.6°F | Wt 215.8 lb

## 2015-07-19 DIAGNOSIS — N939 Abnormal uterine and vaginal bleeding, unspecified: Secondary | ICD-10-CM

## 2015-07-19 DIAGNOSIS — D259 Leiomyoma of uterus, unspecified: Secondary | ICD-10-CM

## 2015-07-19 NOTE — Progress Notes (Signed)
Patient ID: [name], female   DOB: 1966/07/05, 49 y.o.   MRN: [id number]  Chief Complaint  Patient presents with  . Follow-up  wants to schedule hysterectomy  HPI [name] is a 49 y.o. female.  [id number] Patient's last menstrual period was 06/23/2015. H/o fibroid uterus and has had a myomectomy, now has heavy bleeding and desires hysterectomy for definitive therapy. Was denied her financial application for charity care. She was told more information was needed.  HPI  No past medical history on file.  Past Surgical History  Procedure Laterality Date  . Myomectomy  2011  . Cesarean section      Family History  Problem Relation Age of Onset  . Cancer Maternal Grandfather     colon    Social History Social History  Substance Use Topics  . Smoking status: Never Smoker   . Smokeless tobacco: Never Used  . Alcohol Use: No    No Known Allergies  Current Outpatient Prescriptions  Medication Sig Dispense Refill  . cyanocobalamin 100 MCG tablet Take 100 mcg by mouth daily.    . ferrous sulfate 325 (65 FE) MG tablet Take 325 mg by mouth daily with breakfast.    . Omega-3 Fatty Acids (FISH OIL) 1000 MG CAPS Take by mouth.    . Prenatal Vit-Fe Fumarate-FA (MULTIVITAMIN-PRENATAL) 27-0.8 MG TABS tablet Take 1 tablet by mouth daily at 12 noon.    Marland Kitchen SPIRULINA PO Take by mouth.    . Multiple Vitamins-Minerals (MULTIVITAMIN WITH MINERALS) tablet Take 1 tablet by mouth daily.    . norgestimate-ethinyl estradiol (ORTHO-CYCLEN,SPRINTEC,PREVIFEM) 0.25-35 MG-MCG tablet Take 1 tablet by mouth daily. (Patient not taking: Reported on 07/19/2015) 1 Package 11   No current facility-administered medications for this visit.    Review of Systems Review of Systems  Constitutional: Negative.   Genitourinary: Positive for menstrual problem and pelvic pain. Negative for vaginal discharge.    Blood pressure 146/100, pulse 91, temperature 98.6 F (37 C), weight 215 lb 12.8  oz (97.886 kg), last menstrual period 06/23/2015.  Physical Exam Physical Exam  Constitutional: She is oriented to person, place, and time. She appears well-developed. No distress.  Pulmonary/Chest: Effort normal.  Neurological: She is alert and oriented to person, place, and time.  Psychiatric: She has a normal mood and affect. Her behavior is normal.    Data Reviewed [place] result, office notes and op note  Assessment    Fibroid uterus and DUB     Plan    Will get necessary financial information and if approved may have surgery scheduled. Requested f/u appointment.        [name] 07/19/2015, 9:25 AM

## 2015-07-19 NOTE — Patient Instructions (Signed)
Abdominal Hysterectomy  Abdominal hysterectomy is a surgical procedure to remove your womb (uterus). Your uterus is the muscular organ that contains a developing baby. This surgery is done for many reasons. You may need an abdominal hysterectomy if you have cancer, growths (tumors), long-term pain, or bleeding. You may also have this procedure if your uterus has slipped down into your vagina (uterine prolapse).  Depending on why you need an abdominal hysterectomy, you may also have other reproductive organs removed. These could include the part of your vagina that connects with your uterus (cervix), the organs that make eggs (ovaries), and the tubes that connect the ovaries to the uterus (fallopian tubes).  LET YOUR HEALTH CARE PROVIDER KNOW ABOUT:    Any allergies you have.   All medicines you are taking, including vitamins, herbs, eye drops, creams, and over-the-counter medicines.   Previous problems you or members of your family have had with the use of anesthetics.   Any blood disorders you have.   Previous surgeries you have had.   Medical conditions you have.  RISKS AND COMPLICATIONS  Generally, this is a safe procedure. However, as with any procedure, problems can occur. Infection is the most common problem after an abdominal hysterectomy. Other possible problems include:   Bleeding.   Formation of blood clots that may break free and travel to your lungs.   Injury to other organs near your uterus.   Nerve injury causing nerve pain.   Decreased interest in sex or pain during sexual intercourse.  BEFORE THE PROCEDURE   Abdominal hysterectomy is a major surgical procedure. It can affect the way you feel about yourself. Talk to your health care provider about the physical and emotional changes hysterectomy may cause.   You may need to have blood work and X-rays done before surgery.   Quit smoking if you smoke. Ask your health care provider for help if you are struggling to quit.   Stop taking  medicines that thin your blood as directed by your health care provider.   You may be instructed to take antibiotic medicines or laxatives before surgery.   Do not eat or drink anything for 6-8 hours before surgery.   Take your regular medicines with a small sip of water.   Bathe or shower the night or morning before surgery.  PROCEDURE   Abdominal hysterectomy is done in the operating room at the hospital.   In most cases, you will be given a medicine that makes you go to sleep (general anesthetic).   The surgeon will make a cut (incision) through the skin in your lower belly.   The incision may be about 5-7 inches long. It may go side-to-side or up-and-down.   The surgeon will move aside the body tissue that covers your uterus. The surgeon will then carefully take out your uterus along with any of your other reproductive organs that need to be removed.   Bleeding will be controlled with clamps or sutures.   The surgeon will close your incision with sutures or metal clips.  AFTER THE PROCEDURE   You will have some pain immediately after the procedure.   You will be given pain medicine in the recovery room.   You will be taken to your hospital room when you have recovered from the anesthesia.   You may need to stay in the hospital for 2-5 days.   You will be given instructions for recovery at home.     This information is not intended 

## 2015-08-15 ENCOUNTER — Ambulatory Visit: Payer: Self-pay | Admitting: Obstetrics & Gynecology

## 2015-09-03 ENCOUNTER — Telehealth: Payer: Self-pay | Admitting: *Deleted

## 2015-09-03 NOTE — Telephone Encounter (Signed)
Pt left message stating that she typically has cycles which last 7-10 days however she has been bleeding for 16 days with current cycle. She has office appt scheduled on 1/5 but wants to know if she needs to be seen @ ED prior to that appt.

## 2015-09-04 NOTE — Telephone Encounter (Signed)
Spoke with patient concerning abnormal bleeding patient stated she is not bleeding through more than one pad per hour but if this starts she will need to call us back. Pt also wanted to know if this was normal in women who have a history of fibroids. I explained to patient that most women have abnormal bleeding with fibroids and this will be explained more in details at office visit. Pt verbalizes understanding.

## 2015-09-12 ENCOUNTER — Ambulatory Visit: Payer: Self-pay | Admitting: Obstetrics & Gynecology

## 2015-10-03 ENCOUNTER — Ambulatory Visit: Payer: Self-pay | Admitting: Obstetrics & Gynecology

## 2016-05-28 IMAGING — US US TRANSVAGINAL NON-OB
1 series · 15 of 25 positions shown · non-contrast
Comparison: 10/30/2008

CLINICAL DATA: Abnormal uterine bleeding.  Status post myomectomy.



[Series 1: us transvaginal non-ob · 43 acquisitions, 15 frames shown]
[im 1/43]
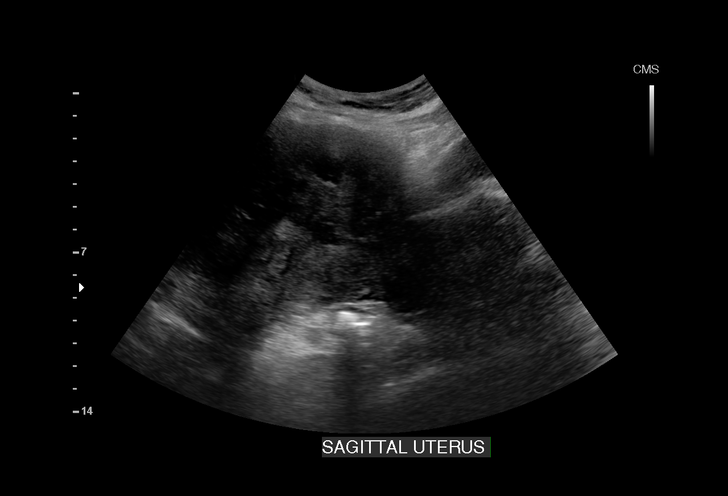
[im 4/43]
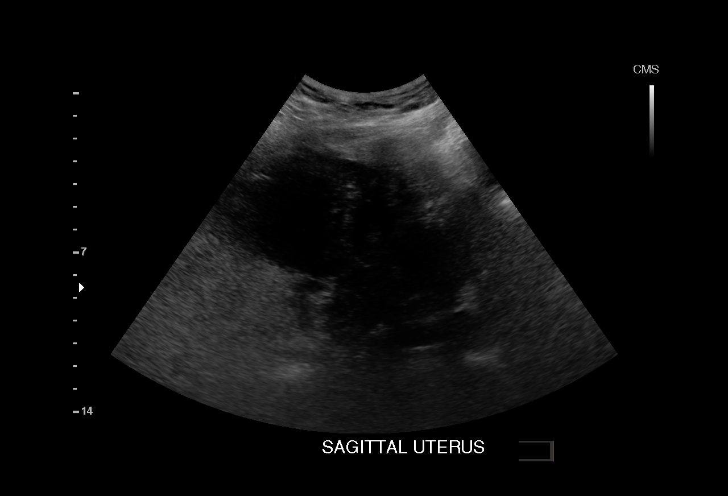
[im 8/43]
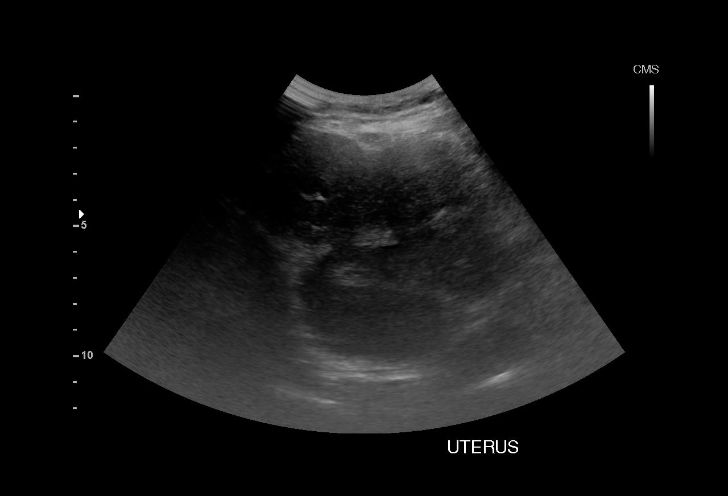
[im 9/43]
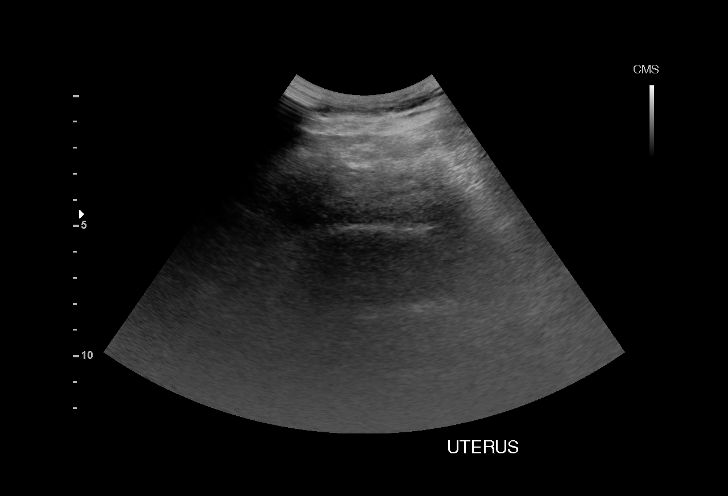
[im 13/43]
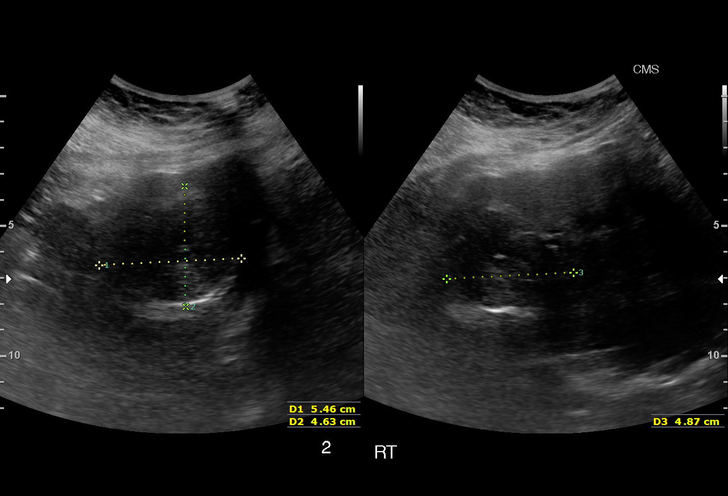
[im 16/43]
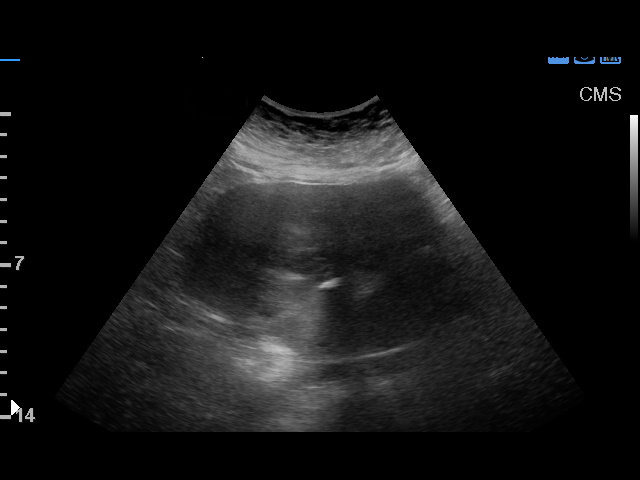
[im 18/43]
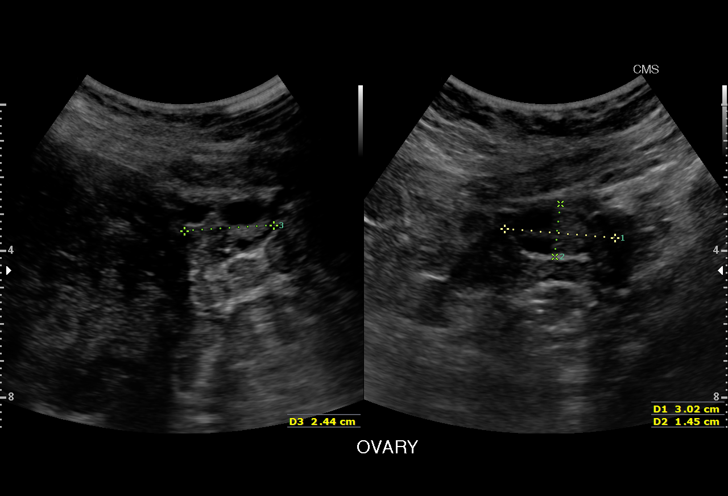
[im 22/43]
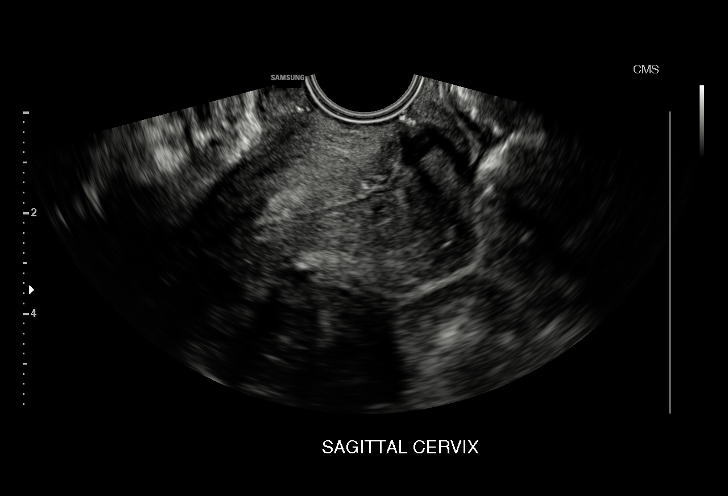
[im 25/43]
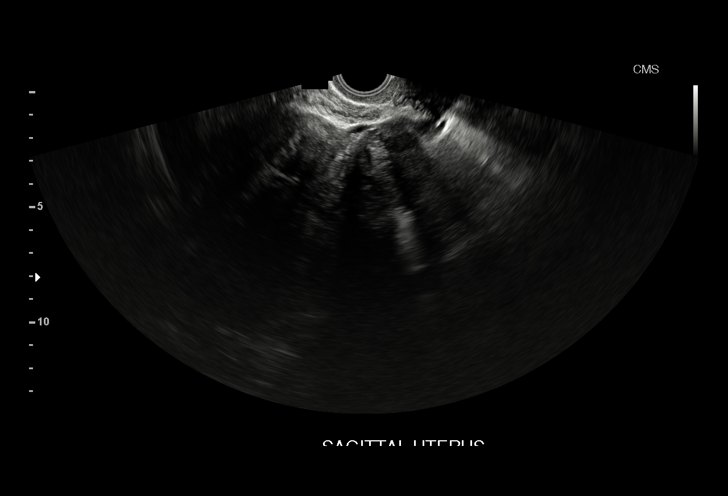
[im 27/43]
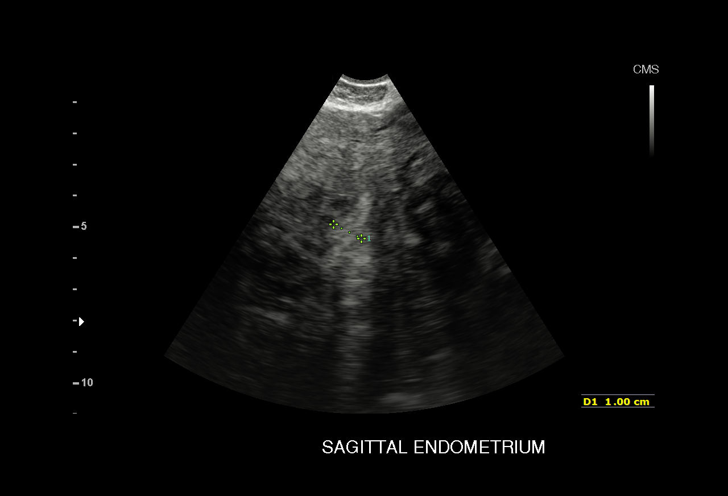
[im 30/43]
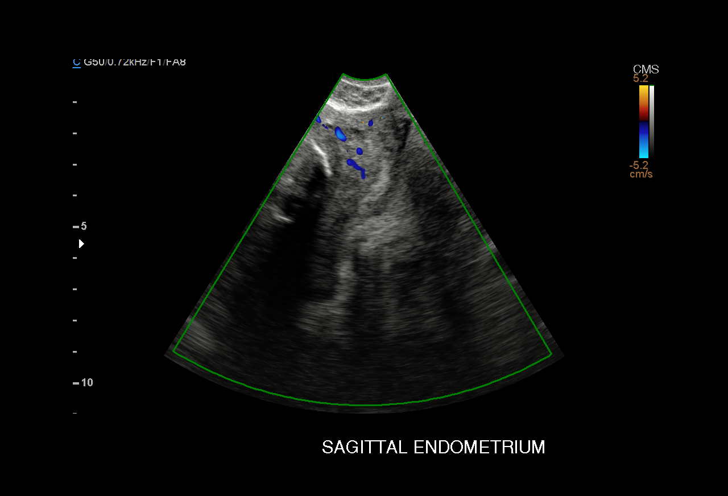
[im 34/43]
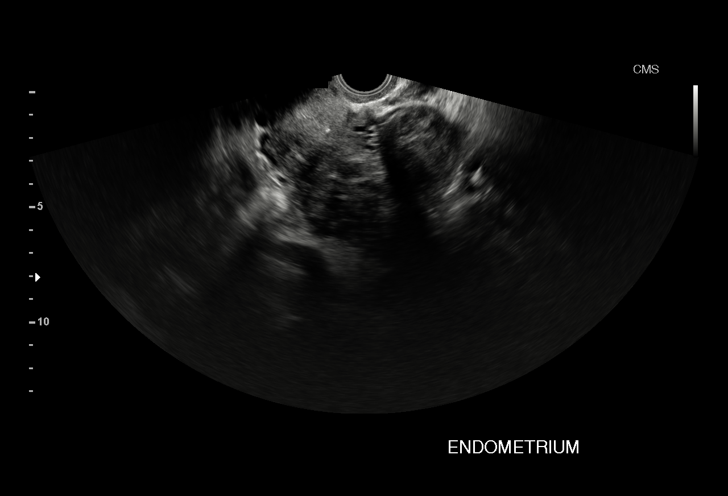
[im 36/43]
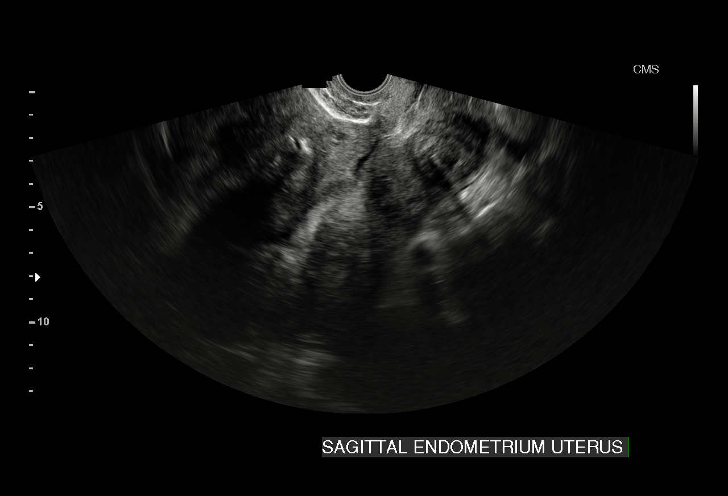
[im 39/43]
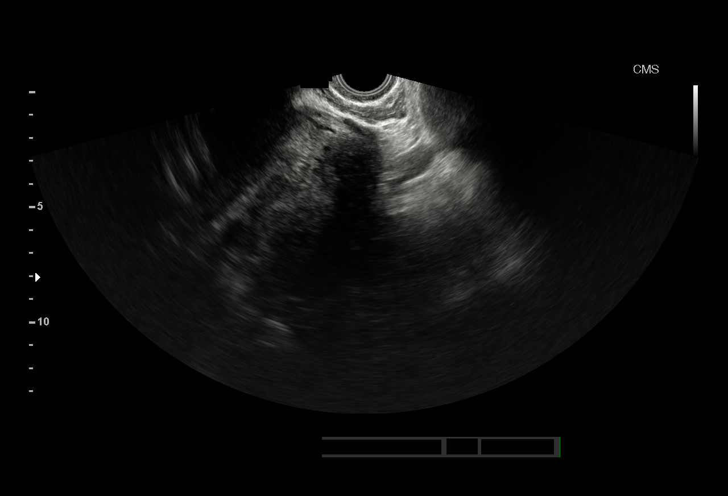
[im 43/43]
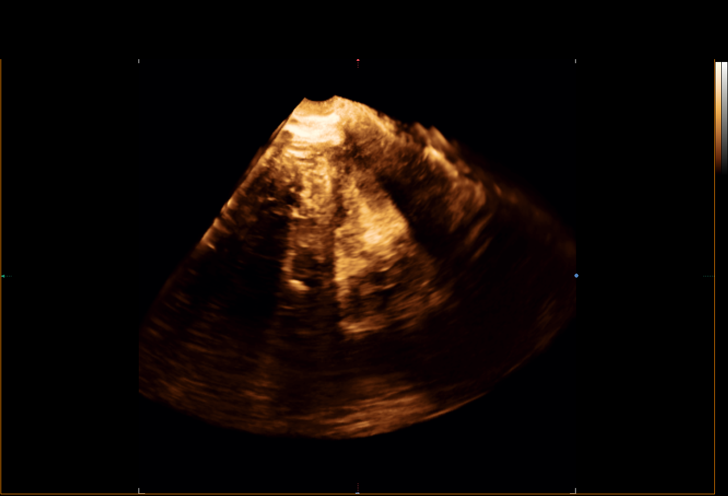

[15 of 25 positions shown; findings below may reference images not displayed]

FINDINGS: Uterus

Measurements: 12.4 x 9.3 x 12.6 cm with a volume of 727 cc.
Previously the uterus measured 19.3 x 8.4 x 12.0 cm. With a volume
of 972 cc Large fundal fibroid measures 7.7 x 6.6 x 5.6 cm. This
appears subserosal. Posterior subserosal fibroid measures 5.4 x
x 4.6 cm. Right lateral myometrial fibroid is partially submucosal
measuring 5.5 x 4.9 x 4.6 cm.

Endometrium

Thickness: 10 mm..  No focal abnormality visualized.

Right ovary

Measurements: Not visualize.

Left ovary

Measurements: 3 x 2.4 x 1.5 cm. Normal appearance/no adnexal mass.

Other findings

No free fluid.
IMPRESSION: 1. Fibroid uterus. Decreased and overall volume when compared with
10/30/2008.
2. Again noted is a subserosal fibroid which is similar in size to
previous exam.
3. Consider further evaluation with sonohysterogram for confirmation
prior to hysteroscopy. Endometrial sampling should also be
considered if patient is at high risk for endometrial carcinoma.
(Ref: Radiological Reasoning: Algorithmic Workup of Abnormal Vaginal
Bleeding with Endovaginal Sonography and Sonohysterography. AJR
[id number]; [id number])

## 2018-01-19 ENCOUNTER — Other Ambulatory Visit: Payer: Self-pay | Admitting: Obstetrics & Gynecology

## 2018-01-19 DIAGNOSIS — Z1231 Encounter for screening mammogram for malignant neoplasm of breast: Secondary | ICD-10-CM

## 2018-02-23 ENCOUNTER — Ambulatory Visit: Payer: Self-pay | Admitting: Obstetrics & Gynecology

## 2018-02-23 ENCOUNTER — Encounter: Payer: Self-pay | Admitting: *Deleted

## 2018-02-23 NOTE — Progress Notes (Signed)
[name] did not keep her scheduled appointment for annual.. Per discussion with Dr. Roselie Awkward does not need to be called, may reschedule if she calls.

## 2018-06-16 ENCOUNTER — Ambulatory Visit: Payer: Self-pay | Admitting: Obstetrics & Gynecology

## 2021-07-24 ENCOUNTER — Telehealth: Payer: Self-pay | Admitting: Lactation Services

## 2021-07-24 ENCOUNTER — Ambulatory Visit: Payer: Self-pay | Admitting: Obstetrics and Gynecology

## 2021-07-24 NOTE — Telephone Encounter (Signed)
Patient called and LM on Lactation line that she was calling to reschedule her appointment in the office. She reports she has been trying to call for over 2 hours. Will forward to front office staff.

## 2021-07-29 ENCOUNTER — Telehealth: Payer: Self-pay | Admitting: Family Medicine

## 2021-07-29 NOTE — Telephone Encounter (Signed)
Called patient back from a message left on LM line stating she was needing to reschedule her appointment. There was no answer to the phone call.

## 2021-09-17 ENCOUNTER — Ambulatory Visit: Payer: Self-pay | Admitting: Obstetrics and Gynecology

## 2021-11-14 ENCOUNTER — Ambulatory Visit: Payer: Self-pay | Admitting: Medical

## 2022-02-25 ENCOUNTER — Other Ambulatory Visit: Payer: Self-pay | Admitting: Obstetrics & Gynecology

## 2022-09-16 ENCOUNTER — Other Ambulatory Visit: Payer: Self-pay | Admitting: Obstetrics & Gynecology

## 2022-09-16 DIAGNOSIS — Z1231 Encounter for screening mammogram for malignant neoplasm of breast: Secondary | ICD-10-CM

## 2022-09-18 ENCOUNTER — Inpatient Hospital Stay: Admission: RE | Admit: 2022-09-18 | Payer: Self-pay | Source: Ambulatory Visit

## 2022-09-18 ENCOUNTER — Ambulatory Visit: Payer: Self-pay

## 2022-09-25 ENCOUNTER — Ambulatory Visit: Payer: Self-pay

## 2022-10-02 ENCOUNTER — Inpatient Hospital Stay: Admission: RE | Admit: 2022-10-02 | Payer: Self-pay | Source: Ambulatory Visit

## 2022-11-03 ENCOUNTER — Ambulatory Visit: Payer: Self-pay

## 2022-11-09 ENCOUNTER — Ambulatory Visit: Payer: Self-pay

## 2022-11-10 ENCOUNTER — Ambulatory Visit
Admission: RE | Admit: 2022-11-10 | Discharge: 2022-11-10 | Disposition: A | Discharge status: Home / Self Care | Payer: Managed Care, Other (non HMO) | Source: Ambulatory Visit | Attending: Obstetrics & Gynecology | Admitting: Obstetrics & Gynecology

## 2022-11-10 ENCOUNTER — Inpatient Hospital Stay: Admission: RE | Admit: 2022-11-10 | Payer: Self-pay | Source: Ambulatory Visit

## 2022-11-10 DIAGNOSIS — Z1231 Encounter for screening mammogram for malignant neoplasm of breast: Secondary | ICD-10-CM

## 2022-11-30 ENCOUNTER — Telehealth: Payer: Self-pay | Admitting: Family Medicine

## 2022-11-30 NOTE — Telephone Encounter (Signed)
Calling for test results ?

## 2022-12-02 ENCOUNTER — Ambulatory Visit: Payer: Self-pay | Admitting: Obstetrics & Gynecology

## 2022-12-02 NOTE — Telephone Encounter (Signed)
Call placed to pt. No VM message left due to unsure of patient on VM. Will send mychart message back to pt about results.  Colletta [place], RNC
